# Patient Record
Sex: Male | Born: 1980
Health system: Southern US, Community
[De-identification: ages and names within clinical notes are randomized; demographics above are authoritative.]

## PROBLEM LIST (undated history)

## (undated) DIAGNOSIS — I10 Essential (primary) hypertension: Secondary | ICD-10-CM

## (undated) DIAGNOSIS — E669 Obesity, unspecified: Secondary | ICD-10-CM

## (undated) HISTORY — DX: Essential (primary) hypertension: I10

## (undated) HISTORY — PX: TYMPANOSTOMY TUBE PLACEMENT: SHX32

## (undated) HISTORY — DX: Obesity, unspecified: E66.9

## (undated) HISTORY — PX: KNEE SURGERY: SHX244

## (undated) HISTORY — PX: TUMOR REMOVAL: SHX12

---

## 2011-08-08 ENCOUNTER — Encounter: Payer: Self-pay | Admitting: Emergency Medicine

## 2011-08-08 ENCOUNTER — Inpatient Hospital Stay (INDEPENDENT_AMBULATORY_CARE_PROVIDER_SITE_OTHER)
Admission: RE | Admit: 2011-08-08 | Discharge: 2011-08-08 | Disposition: A | Discharge status: Home / Self Care | Payer: 59 | Source: Ambulatory Visit | Attending: Emergency Medicine | Admitting: Emergency Medicine

## 2011-08-08 DIAGNOSIS — I10 Essential (primary) hypertension: Secondary | ICD-10-CM | POA: Insufficient documentation

## 2011-08-08 DIAGNOSIS — L2089 Other atopic dermatitis: Secondary | ICD-10-CM

## 2011-11-05 NOTE — Progress Notes (Signed)
Summary: ITCHING   Vital Signs:  Patient Profile:   30 Years Old Male CC:      itching x 3days Weight:      343.25 pounds O2 Sat:      97 % O2 treatment:    Room Air Temp:     98.2 degrees F oral Pulse rate:   91 / minute Resp:     91 per minute BP sitting:   120 / 88  (left arm) Cuff size:   regular  Vitals Entered By: Clemens Catholic LPN (August 08, 2011 10:35 AM)                  Updated Prior Medication List: ALLEGRA ALLERGY 180 MG TABS (FEXOFENADINE HCL)  LISINOPRIL-HYDROCHLOROTHIAZIDE 20-25 MG TABS (LISINOPRIL-HYDROCHLOROTHIAZIDE)   Current Allergies: ! * DUST, MOLD, CATS, DOGSHistory of Present Illness History from: patient Chief Complaint: itching x 3days History of Present Illness: He has been itching for 3 days.   No new soaps, shampoos, detergents.  He did put on sunscreen a few days ago and spent some time outside.  Very itchy all over.  No rashes or lesions.  His wife doesn't have any symptoms.  No bites, etc.  Oatmeal bath helps.  Cortisone cream doesn't.  REVIEW OF SYSTEMS Constitutional Symptoms      Denies fever, chills, night sweats, weight loss, weight gain, and fatigue.  Eyes       Denies change in vision, eye pain, eye discharge, glasses, contact lenses, and eye surgery. Ear/Nose/Throat/Mouth       Denies hearing loss/aids, change in hearing, ear pain, ear discharge, dizziness, frequent runny nose, frequent nose bleeds, sinus problems, sore throat, hoarseness, and tooth pain or bleeding.  Respiratory       Denies dry cough, productive cough, wheezing, shortness of breath, asthma, bronchitis, and emphysema/COPD.  Cardiovascular       Denies murmurs, chest pain, and tires easily with exhertion.    Gastrointestinal       Denies stomach pain, nausea/vomiting, diarrhea, constipation, blood in bowel movements, and indigestion. Genitourniary       Denies painful urination, kidney stones, and loss of urinary control. Neurological       Denies  paralysis, seizures, and fainting/blackouts. Musculoskeletal       Denies muscle pain, joint pain, joint stiffness, decreased range of motion, redness, swelling, muscle weakness, and gout.  Skin       Denies bruising, unusual mles/lumps or sores, and hair/skin or nail changes.  Psych       Denies mood changes, temper/anger issues, anxiety/stress, speech problems, depression, and sleep problems. Other Comments: pt c/o itching all over x 3 days. started at the back of his knees and has continued to spread. he has taken benadryl and used oatmeal baths.   Past History:  Past Medical History: Hypertension  Past Surgical History: knee surgery  Family History: CA MS HTN  Social History: Never Smoked Alcohol use-no Drug use-no Smoking Status:  never Drug Use:  no Physical Exam General appearance: well developed, well nourished, no acute distress other than scratching Skin: no obvious rashes or lesions MSE: oriented to time, place, and person Assessment New Problems: DERMATITIS, ATOPIC (ICD-691.8) HYPERTENSION (ICD-401.9)   Plan New Medications/Changes: PREDNISONE (PAK) 10 MG TABS (PREDNISONE) 6 day pack, use as directed  #1 x 0, 08/08/2011, Hoyt Koch MD  New Orders: New Patient Level II [phone] Planning Comments:   Will put him on Prednisone.  Oral benedryl, oatmeal baths.  If  not improving, consider Elimite.  Follow-up with your primary care physician if not improving or if getting worse   The patient and/or caregiver has been counseled thoroughly with regard to medications prescribed including dosage, schedule, interactions, rationale for use, and possible side effects and they verbalize understanding.  Diagnoses and expected course of recovery discussed and will return if not improved as expected or if the condition worsens. Patient and/or caregiver verbalized understanding.  Prescriptions: PREDNISONE (PAK) 10 MG TABS (PREDNISONE) 6 day pack, use as directed  #1  x 0   Entered and Authorized by:   Hoyt Koch MD   Signed by:   Hoyt Koch MD on 08/08/2011   Method used:   Print then Give to Patient   RxID:   [id number]   Orders Added: 1)  New Patient Level II [30865]

## 2012-05-29 DIAGNOSIS — E669 Obesity, unspecified: Secondary | ICD-10-CM | POA: Insufficient documentation

## 2012-05-29 DIAGNOSIS — E668 Other obesity: Secondary | ICD-10-CM | POA: Insufficient documentation

## 2013-03-24 ENCOUNTER — Ambulatory Visit (INDEPENDENT_AMBULATORY_CARE_PROVIDER_SITE_OTHER): Payer: 59 | Admitting: Sports Medicine

## 2013-03-24 ENCOUNTER — Other Ambulatory Visit: Payer: Self-pay | Admitting: *Deleted

## 2013-03-24 ENCOUNTER — Encounter: Payer: Self-pay | Admitting: Sports Medicine

## 2013-03-24 VITALS — BP 166/95 | HR 91 | Wt 346.0 lb

## 2013-03-24 DIAGNOSIS — E669 Obesity, unspecified: Secondary | ICD-10-CM

## 2013-03-24 DIAGNOSIS — Z113 Encounter for screening for infections with a predominantly sexual mode of transmission: Secondary | ICD-10-CM | POA: Insufficient documentation

## 2013-03-24 DIAGNOSIS — Z0000 Encounter for general adult medical examination without abnormal findings: Secondary | ICD-10-CM | POA: Insufficient documentation

## 2013-03-24 DIAGNOSIS — I10 Essential (primary) hypertension: Secondary | ICD-10-CM | POA: Insufficient documentation

## 2013-03-24 DIAGNOSIS — Z299 Encounter for prophylactic measures, unspecified: Secondary | ICD-10-CM

## 2013-03-24 MED ORDER — LISINOPRIL 20 MG PO TABS: 20.0000 mg | ORAL_TABLET | Freq: Every day | ORAL | Status: DC

## 2013-03-24 MED ORDER — AMLODIPINE BESYLATE 10 MG PO TABS: 5.0000 mg | ORAL_TABLET | Freq: Every day | ORAL | Status: DC

## 2013-03-24 MED ORDER — PHENTERMINE HCL 37.5 MG PO CAPS: 37.5000 mg | ORAL_CAPSULE | ORAL | Status: DC

## 2013-03-24 MED ORDER — CHLORTHALIDONE 50 MG PO TABS: 50.0000 mg | ORAL_TABLET | Freq: Every day | ORAL | Status: DC

## 2013-03-24 NOTE — Assessment & Plan Note (Signed)
Nutrition, exercise prescription, phentermine.

## 2013-03-24 NOTE — Progress Notes (Signed)
  Subjective:    CC: Establish care.   HPI: This is a very pleasant 32 year old male who comes in to establish care. He is switching from his prior provider as his access was difficult.  Hypertension: Has never really been well controlled, and has previously been on lisinopril/hydrochlorothiazide. He does work as a Pharmacologist and is wondering if we can switch to chlorthalidone and lisinopril. Denies any chest pain, headaches, visual changes.  Obesity: Tried the [employer] and lost a lot of weight but unfortunately gained it back immediately. Is interested in a multimodal approach to treating his obesity.  STD screening colon would like to be checked for all STDs.  Past medical history, Surgical history, Family history not pertinant except as noted below, Social history, Allergies, and medications have been entered into the medical record, reviewed, and no changes needed.   Review of Systems: No headache, visual changes, nausea, vomiting, diarrhea, constipation, dizziness, abdominal pain, skin rash, fevers, chills, night sweats, swollen lymph nodes, weight loss, chest pain, body aches, joint swelling, muscle aches, shortness of breath, mood changes, visual or auditory hallucinations.  Objective:    General: Well Developed, well nourished, and in no acute distress.  Neuro: Alert and oriented x3, extra-ocular muscles intact, sensation grossly intact.  HEENT: Normocephalic, atraumatic, pupils equal round reactive to light, neck supple, no masses, no lymphadenopathy, thyroid nonpalpable.  Skin: Warm and dry, no rashes noted.  Cardiac: Regular rate and rhythm, no murmurs rubs or gallops.  Respiratory: Clear to auscultation bilaterally. Not using accessory muscles, speaking in full sentences.  Abdominal: Soft, nontender, nondistended, positive bowel sounds, no masses, no organomegaly.  Musculoskeletal: Shoulder, elbow, wrist, hip, knee, ankle stable, and with full range of  motion Impression and Recommendations:    The patient was counselled, risk factors were discussed, anticipatory guidance given.

## 2013-03-24 NOTE — Assessment & Plan Note (Signed)
Checking blood work and urine.

## 2013-03-24 NOTE — Patient Instructions (Addendum)
Exercise prescription:  You should adjust the intensity of your exercise based on your heart rate. The [employer] sports medicine recommends keeping your heart rate between 70-80% of its maximum for 30 minutes, 3-5 times per week. Maximum heart rate = (220 - age). Multiply this number by 0.75 to get your goal heart rate. If lower, then increase the intensity of your exercise. If the number is higher, you may decrease the intensity of your exercise. 

## 2013-03-24 NOTE — Assessment & Plan Note (Signed)
Switching to lisinopril and chlorthalidone.

## 2013-04-21 ENCOUNTER — Ambulatory Visit (INDEPENDENT_AMBULATORY_CARE_PROVIDER_SITE_OTHER): Payer: 59 | Admitting: Sports Medicine

## 2013-04-21 VITALS — BP 149/85 | HR 101 | Wt 333.0 lb

## 2013-04-21 DIAGNOSIS — E669 Obesity, unspecified: Secondary | ICD-10-CM

## 2013-04-21 DIAGNOSIS — I10 Essential (primary) hypertension: Secondary | ICD-10-CM

## 2013-04-21 MED ORDER — PHENTERMINE HCL 37.5 MG PO CAPS: 37.5000 mg | ORAL_CAPSULE | ORAL | Status: DC

## 2013-04-21 NOTE — Progress Notes (Signed)
  Subjective:    CC: Followup  HPI: Hypertension: Greatly improved on 5 mg of amlodipine, 20 mg lisinopril, 50 mg chlorthalidone.  Obesity: 13 pounds down.  Skin tags: Does not want them removed.  Past medical history, Surgical history, Family history not pertinant except as noted below, Social history, Allergies, and medications have been entered into the medical record, reviewed, and no changes needed.   Review of Systems: No fevers, chills, night sweats, weight loss, chest pain, or shortness of breath.   Objective:    General: Well Developed, well nourished, and in no acute distress.  Neuro: Alert and oriented x3, extra-ocular muscles intact, sensation grossly intact.  HEENT: Normocephalic, atraumatic, pupils equal round reactive to light, neck supple, no masses, no lymphadenopathy, thyroid nonpalpable.  Skin: Warm and dry, no rashes. Skin tags present. Cardiac: Regular rate and rhythm, no murmurs rubs or gallops, no lower extremity edema.  Respiratory: Clear to auscultation bilaterally. Not using accessory muscles, speaking in full sentences. Impression and Recommendations:

## 2013-04-21 NOTE — Assessment & Plan Note (Signed)
13 pounds weight loss so far. Need to keep an eye on tachycardia at the next visit. Does have nutritionist visit coming up. Refilling phentermine.

## 2013-04-21 NOTE — Assessment & Plan Note (Signed)
Pressures greatly improved, increasing to 10 mg of amlodipine.

## 2013-04-30 ENCOUNTER — Encounter: Payer: Self-pay | Admitting: *Deleted

## 2013-04-30 ENCOUNTER — Encounter: Payer: 59 | Attending: Sports Medicine | Admitting: *Deleted

## 2013-04-30 VITALS — Ht 70.0 in | Wt 331.9 lb

## 2013-04-30 DIAGNOSIS — E663 Overweight: Secondary | ICD-10-CM | POA: Insufficient documentation

## 2013-04-30 DIAGNOSIS — E669 Obesity, unspecified: Secondary | ICD-10-CM

## 2013-04-30 DIAGNOSIS — Z713 Dietary counseling and surveillance: Secondary | ICD-10-CM | POA: Insufficient documentation

## 2013-04-30 DIAGNOSIS — I10 Essential (primary) hypertension: Secondary | ICD-10-CM

## 2013-04-30 NOTE — Progress Notes (Signed)
Medical Nutrition Therapy:  Appt start time: 0900 end time:  1000.  Assessment:  Primary concerns today: Alexander Russo is here for weight loss education.  He has attempted to lose weight on his own via [employer].  He has been on and off diets about 3 times and is usually successful, but then plateaus and gets frustrated then gains the weight back.  He hasn't been as active before and is trying to be more active.  He is now doing physician's assisted weight loss with phentermine    He had knee surgery at 16.  He was heavy as a child, but very active.  After the surgery, he had to stop contact sports.  He ate the same way as before, but couldn't be active.  His heaviest weight is about 354 lb.  He isn't sure about his lowest adult weight, but maybe around 230 after her lost about 90 pounds with Alexander Russo.  He would love to weigh about 200 lb.  He has been making changes for the past month or so.  MEDICATIONS: see list   DIETARY INTAKE:  Usual eating pattern includes 3 meals and 1-2 snacks per day.  Everyday foods include protein, starches.  Avoided foods include doesn't eat that much sweets, cakes, etc.  Limits fried food.    24-hr recall:  B ( AM): frozen hashbrown pattie.  Whole wheat mini bagel with banana ketchup and cream cheese.    Snk ( AM): beef jerky (low-sodium)  L ( PM): cookout chicken quesadilla or grilled chicken sandwich or fried chicken wrap.  Sometimes doesn't eat the bread Snk ( PM): used to have greek yogurt, but not much any more.  Doesn't snack much D ( PM): once a week has vegetarian night.  usually eats home-cooked meal.  Meat, starch. typically has a vegetable, but not so much lately.  Has been going out a little more often Snk ( PM): 2 servings of sour patch kids or 2 frozen popsicle Beverages: water, maybe diluted tea sometimes  Usual physical activity: PE class twice a week.  On his feet at work.  Has an exercise prescription, but hasn't gotten into it yet  Estimated energy  needs: 2600 calories 292 g carbohydrates 195 g protein 72 g fat  Progress Towards Goal(s):  In progress.   Nutritional Diagnosis:  [place]-3.3 Overweight/obesity As related to genetic predisposition towards heavier size combined with limtied adherance to interal hunger and fullness cues.  As evidenced by BMI>45    Intervention:  Nutrition counseling provided.  Alexander Russo's diet is low in whole grains, dairy, fruits, and vegetables.  Recommended adding yogurt and fruits back in as snacks.  Recommended bringing lunch from home in order to use whole grain breads, fruits, leaner proteins, maybe vegetables, etc.  Right now his lunch time is only 30 minutes so he's driving to fast food and scarfing down his lunch in his car quickly  Also recommended cooking more at home in order to have whole grains like brown rice, quinoa, whole wheat spaghetti, etc.  Eating more at home will reduce his sodium consumption.  This will also allow more vegetables.  Recommended the farmers' market so that his kids can be exposed to new vegetables while they're young. Educated Alexander Russo to eat more slowly. Sit at a table, not in the living room or the car, etc.  Aim to make meals last at least 20 minutes in order to have time to register satiety. Take smaller bites, chew food thoroughly, etc.  Gauge  fullness throughout the meal.  Stop eating when comfortably full, and not stuffed.  During that day, take time to gauge hunger.  Eat when physically hungry, not when bored.  Do not skip meals to snacks; eat when hungry.  Honor internal hunger/fullness cues, not a prescribed calorie limit.  Encouraged patient to reject traditional diet mentality of "good" vs "bad" foods.  There are no good and bad foods, but rather food is fuel that we needs for our bodies.  When we don't get enough fuel, our bodies suffer the metabolic consequences.    I also suggested Alexander Mellendick, MS, RD, LDN, CSCS for exercise guidance   Monitoring/Evaluation:  Dietary  intake, exercise, and body weight in 2 month(s).  Patient will call to set up follow up visit

## 2013-05-26 ENCOUNTER — Ambulatory Visit (INDEPENDENT_AMBULATORY_CARE_PROVIDER_SITE_OTHER): Payer: 59 | Admitting: Sports Medicine

## 2013-05-26 VITALS — BP 135/82 | HR 103 | Wt 330.0 lb

## 2013-05-26 DIAGNOSIS — E669 Obesity, unspecified: Secondary | ICD-10-CM

## 2013-05-26 DIAGNOSIS — I10 Essential (primary) hypertension: Secondary | ICD-10-CM

## 2013-05-26 MED ORDER — LISINOPRIL 20 MG PO TABS: 20.0000 mg | ORAL_TABLET | Freq: Every day | ORAL | Status: DC

## 2013-05-26 MED ORDER — CHLORTHALIDONE 50 MG PO TABS: 50.0000 mg | ORAL_TABLET | Freq: Every day | ORAL | Status: DC

## 2013-05-26 MED ORDER — AMLODIPINE BESYLATE 10 MG PO TABS: 10.0000 mg | ORAL_TABLET | Freq: Every day | ORAL | Status: DC

## 2013-05-26 MED ORDER — PHENTERMINE HCL 37.5 MG PO TABS: 37.5000 mg | ORAL_TABLET | Freq: Every day | ORAL | Status: DC

## 2013-05-26 NOTE — Progress Notes (Signed)
  Subjective:    CC: Followup.  HPI: Hypertension:  Overall better controlled, needs refills.  Obesity: Has seen the nutritionist, overall 3 pounds weight loss, due for a refill, no adverse effects.  Past medical history, Surgical history, Family history not pertinant except as noted below, Social history, Allergies, and medications have been entered into the medical record, reviewed, and no changes needed.   Review of Systems: No fevers, chills, night sweats, weight loss, chest pain, or shortness of breath.   Objective:    General: Well Developed, well nourished, and in no acute distress.  Neuro: Alert and oriented x3, extra-ocular muscles intact, sensation grossly intact.  HEENT: Normocephalic, atraumatic, pupils equal round reactive to light, neck supple, no masses, no lymphadenopathy, thyroid nonpalpable.  Skin: Warm and dry, no rashes. Cardiac: Regular rate and rhythm, no murmurs rubs or gallops, no lower extremity edema.  Respiratory: Clear to auscultation bilaterally. Not using accessory muscles, speaking in full sentences.  Impression and Recommendations:

## 2013-05-26 NOTE — Assessment & Plan Note (Signed)
Refilling phentermine. Return in one month for weight check and refills.

## 2013-05-26 NOTE — Assessment & Plan Note (Signed)
Controlled, no changes. 

## 2013-06-30 ENCOUNTER — Encounter: Payer: Self-pay | Admitting: Sports Medicine

## 2013-06-30 ENCOUNTER — Ambulatory Visit (INDEPENDENT_AMBULATORY_CARE_PROVIDER_SITE_OTHER): Payer: 59 | Admitting: Sports Medicine

## 2013-06-30 VITALS — BP 132/78 | HR 96 | Wt 337.0 lb

## 2013-06-30 DIAGNOSIS — I10 Essential (primary) hypertension: Secondary | ICD-10-CM

## 2013-06-30 DIAGNOSIS — E669 Obesity, unspecified: Secondary | ICD-10-CM

## 2013-06-30 MED ORDER — PHENTERMINE HCL 37.5 MG PO TABS: 37.5000 mg | ORAL_TABLET | Freq: Every day | ORAL | Status: DC

## 2013-06-30 MED ORDER — TOPIRAMATE 50 MG PO TABS: ORAL_TABLET | ORAL | Status: DC

## 2013-06-30 MED ORDER — CHLORTHALIDONE 100 MG PO TABS: 100.0000 mg | ORAL_TABLET | Freq: Every day | ORAL | Status: DC

## 2013-06-30 NOTE — Progress Notes (Signed)
  Subjective:    CC: Followup  HPI: Obesity: Unfortunately [place] has gained some weight, he go on a road trip and he ate poorly.  Hypertension: Overall unchanged from the prior visit. No chest pain, visual changes, headaches.  Past medical history, Surgical history, Family history not pertinant except as noted below, Social history, Allergies, and medications have been entered into the medical record, reviewed, and no changes needed.   Review of Systems: No fevers, chills, night sweats, weight loss, chest pain, or shortness of breath.   Objective:    General: Well Developed, well nourished, and in no acute distress.  Neuro: Alert and oriented x3, extra-ocular muscles intact, sensation grossly intact.  HEENT: Normocephalic, atraumatic, pupils equal round reactive to light, neck supple, no masses, no lymphadenopathy, thyroid nonpalpable.  Skin: Warm and dry, no rashes. Cardiac: Regular rate and rhythm, no murmurs rubs or gallops, 1+ lower extremity edema.  Respiratory: Clear to auscultation bilaterally. Not using accessory muscles, speaking in full sentences.  Impression and Recommendations:

## 2013-06-30 NOTE — Assessment & Plan Note (Signed)
Increasing chlorthalidone.

## 2013-06-30 NOTE — Assessment & Plan Note (Addendum)
Refilling phentermine. I am going to increase his chlorthalidone in the hopes that some of his weight is from water overload. If he continues to gain weight at the next visit we will discontinue phentermine. I am going to add a Topamax.

## 2013-07-28 ENCOUNTER — Ambulatory Visit (INDEPENDENT_AMBULATORY_CARE_PROVIDER_SITE_OTHER): Payer: 59 | Admitting: Sports Medicine

## 2013-07-28 ENCOUNTER — Encounter: Payer: Self-pay | Admitting: Sports Medicine

## 2013-07-28 VITALS — BP 120/70 | HR 95 | Wt 325.0 lb

## 2013-07-28 DIAGNOSIS — I10 Essential (primary) hypertension: Secondary | ICD-10-CM

## 2013-07-28 DIAGNOSIS — E669 Obesity, unspecified: Secondary | ICD-10-CM

## 2013-07-28 MED ORDER — PHENTERMINE HCL 37.5 MG PO TABS: 37.5000 mg | ORAL_TABLET | Freq: Every day | ORAL | Status: DC

## 2013-07-28 NOTE — Assessment & Plan Note (Signed)
Well controlled, no changes 

## 2013-07-28 NOTE — Progress Notes (Signed)
  Subjective:    CC: Followup  HPI: Obesity: 12 pound weight loss since the last visit.  Hypertension: Well controlled with current medications.  Past medical history, Surgical history, Family history not pertinant except as noted below, Social history, Allergies, and medications have been entered into the medical record, reviewed, and no changes needed.   Review of Systems: No fevers, chills, night sweats, weight loss, chest pain, or shortness of breath.   Objective:    General: Well Developed, well nourished, and in no acute distress.  Neuro: Alert and oriented x3, extra-ocular muscles intact, sensation grossly intact.  HEENT: Normocephalic, atraumatic, pupils equal round reactive to light, neck supple, no masses, no lymphadenopathy, thyroid nonpalpable.  Skin: Warm and dry, no rashes. Cardiac: Regular rate and rhythm, no murmurs rubs or gallops, no lower extremity edema.  Respiratory: Clear to auscultation bilaterally. Not using accessory muscles, speaking in full sentences. Impression and Recommendations:

## 2013-07-28 NOTE — Assessment & Plan Note (Signed)
12 pound weight loss, refilling phentermine, needs 37 pills for this refill.

## 2013-07-31 ENCOUNTER — Other Ambulatory Visit: Payer: Self-pay | Admitting: Sports Medicine

## 2013-08-31 ENCOUNTER — Emergency Department (INDEPENDENT_AMBULATORY_CARE_PROVIDER_SITE_OTHER)
Admission: EM | Admit: 2013-08-31 | Discharge: 2013-08-31 | Disposition: A | Discharge status: Home / Self Care | Payer: 59 | Source: Home / Self Care

## 2013-08-31 ENCOUNTER — Encounter: Payer: Self-pay | Admitting: *Deleted

## 2013-08-31 DIAGNOSIS — J209 Acute bronchitis, unspecified: Secondary | ICD-10-CM

## 2013-08-31 MED ORDER — AZITHROMYCIN 250 MG PO TABS: ORAL_TABLET | ORAL | Status: DC

## 2013-08-31 MED ORDER — PREDNISONE (PAK) 10 MG PO TABS: 10.0000 mg | ORAL_TABLET | Freq: Every day | ORAL | Status: DC

## 2013-08-31 MED ORDER — ALBUTEROL SULFATE HFA 108 (90 BASE) MCG/ACT IN AERS: 1.0000 | INHALATION_SPRAY | Freq: Four times a day (QID) | RESPIRATORY_TRACT | Status: DC | PRN

## 2013-08-31 NOTE — ED Notes (Signed)
Alexander Russo c/o congestion, productive cough (green turned clear), fatigue and SOB with exertion. Denies fever. Symptoms started and progressed 4 days ago.

## 2013-08-31 NOTE — ED Provider Notes (Signed)
CSN: [id number]     Arrival date & time 08/31/13  1246 History   None    Chief Complaint  Patient presents with  . URI   (Consider location/radiation/quality/duration/timing/severity/associated sxs/prior Treatment) HPI Arion is a 32 y.o. male who complains of onset of cold symptoms for 7 days.  The symptoms are constant and mild-moderate in severity and improving over the last few days.  However, still with cough and chest congestion.  Everybody in his house is sick.  Lots of stress recently with illness, school, and job. No sore throat + cough (productive green but turned clear) No pleuritic pain +/- wheezing + nasal congestion + post-nasal drainage No sinus pain/pressure + chest congestion (improving) No itchy/red eyes No earache No hemoptysis +/- SOB No chills/sweats No fever No nausea No vomiting No abdominal pain No diarrhea No skin rashes + fatigue No myalgias + headache    Past Medical History  Diagnosis Date  . Hypertension   . Obesity    Past Surgical History  Procedure Laterality Date  . Tumor removal    . Tympanostomy tube placement    . Knee surgery     Family History  Problem Relation Age of Onset  . Cancer Mother   . Depression Mother   . Hypertension Mother   . Multiple sclerosis Mother   . Hypertension Father   . Depression Sister   . Hypertension Sister    History  Substance Use Topics  . Smoking status: Never Smoker   . Smokeless tobacco: Never Used  . Alcohol Use: Yes    Review of Systems  All other systems reviewed and are negative.    Allergies  Review of patient's allergies indicates no known allergies.  Home Medications   Current Outpatient Rx  Name  Route  Sig  Dispense  Refill  . albuterol (PROVENTIL HFA;VENTOLIN HFA) 108 (90 BASE) MCG/ACT inhaler   Inhalation   Inhale 1-2 puffs into the lungs every 6 (six) hours as needed for wheezing.   1 Inhaler   5   . amLODipine (NORVASC) 10 MG tablet   Oral   Take 1  tablet (10 mg total) by mouth daily.   90 tablet   3   . azithromycin (ZITHROMAX Z-PAK) 250 MG tablet      Use as directed   1 each   0   . chlorthalidone (HYGROTEN) 100 MG tablet   Oral   Take 50 mg by mouth daily. Take 1 tablet 2 times a day         . lisinopril (PRINIVIL,ZESTRIL) 20 MG tablet   Oral   Take 1 tablet (20 mg total) by mouth daily.   90 tablet   3   . phentermine (ADIPEX-P) 37.5 MG tablet   Oral   Take 1 tablet (37.5 mg total) by mouth daily before breakfast.   37 tablet   0   . predniSONE (STERAPRED UNI-PAK) 10 MG tablet   Oral   Take 1 tablet (10 mg total) by mouth daily. 6 day pack, use as directed   21 tablet   0   . topiramate (TOPAMAX) 50 MG tablet      Take one half tablet by mouth once daily for a week, then one tablet by mouth daily thereafter.   30 tablet   1    BP 125/82  Pulse 103  Temp(Src) 98.1 F (36.7 C) (Oral)  Resp 18  Ht 5\' 10"  (1.778 m)  Wt 323 lb (146.512 kg)  BMI 46.35 kg/m2  SpO2 100% Physical Exam  Nursing note and vitals reviewed. Constitutional: He is oriented to person, place, and time. He appears well-developed and well-nourished. He does not appear ill.  HENT:  Head: Normocephalic and atraumatic.  Right Ear: Tympanic membrane, external ear and ear canal normal.  Left Ear: Tympanic membrane, external ear and ear canal normal.  Nose: Mucosal edema present.  Mouth/Throat: No oropharyngeal exudate, posterior oropharyngeal edema or posterior oropharyngeal erythema.  Eyes: No scleral icterus.  Neck: Neck supple.  Cardiovascular: Regular rhythm and normal heart sounds.   Pulmonary/Chest: Effort normal and breath sounds normal. No respiratory distress. He has no decreased breath sounds. He has no wheezes. He has no rhonchi.  Neurological: He is alert and oriented to person, place, and time.  Skin: Skin is warm and dry.  Psychiatric: He has a normal mood and affect. His speech is normal.    ED Course  Procedures  (including critical care time) Labs Review Labs Reviewed - No data to display Imaging Review No results found.  MDM   1. Acute bronchitis    1)  Hold ABX unless getting worse.  Rx for pred pack and albuterol given.  Likely viral / post viral mild bronchospasm / bronchitis.  No CXR warranted today. 2)  Use nasal saline solution (over the counter) at least 3 times a day. 3)  Use over the counter decongestants like Zyrtec-D every 12 hours as needed to help with congestion.  If you have hypertension, do not take medicines with sudafed.  4)  Can take tylenol for pain or fever. 5)  Follow up with your primary doctor if no improvement in 5-7 days, sooner if increasing pain, fever, or new symptoms.     Marlaine Hind, MD 08/31/13 1329

## 2013-09-03 ENCOUNTER — Ambulatory Visit (INDEPENDENT_AMBULATORY_CARE_PROVIDER_SITE_OTHER): Payer: 59 | Admitting: Sports Medicine

## 2013-09-03 ENCOUNTER — Encounter: Payer: Self-pay | Admitting: Sports Medicine

## 2013-09-03 VITALS — BP 147/90 | HR 97 | Wt 327.0 lb

## 2013-09-03 DIAGNOSIS — I10 Essential (primary) hypertension: Secondary | ICD-10-CM

## 2013-09-03 DIAGNOSIS — E669 Obesity, unspecified: Secondary | ICD-10-CM

## 2013-09-03 MED ORDER — TOPIRAMATE 50 MG PO TABS: ORAL_TABLET | ORAL | Status: DC

## 2013-09-03 MED ORDER — PHENTERMINE HCL 37.5 MG PO TABS: 37.5000 mg | ORAL_TABLET | Freq: Every day | ORAL | Status: DC

## 2013-09-03 NOTE — Assessment & Plan Note (Signed)
Refilling for 3 months. Return in 3 months.

## 2013-09-03 NOTE — Assessment & Plan Note (Signed)
Will recheck in 3 months, forgot a couple BP medications.

## 2013-09-03 NOTE — Progress Notes (Signed)
  Subjective:    CC: Weight check  HPI: Obesity: Gained 2 pounds since last visit, continues on phentermine and Topamax. No side effects.  Hypertension: Slightly elevated since last visit, was perfectly controlled before. Currently on amlodipine, chlorthalidone, lisinopril. He does tell me he forgot his amlodipine.  Past medical history, Surgical history, Family history not pertinant except as noted below, Social history, Allergies, and medications have been entered into the medical record, reviewed, and no changes needed.   Review of Systems: No fevers, chills, night sweats, weight loss, chest pain, or shortness of breath.   Objective:    General: Well Developed, well nourished, and in no acute distress.  Neuro: Alert and oriented x3, extra-ocular muscles intact, sensation grossly intact.  HEENT: Normocephalic, atraumatic, pupils equal round reactive to light, neck supple, no masses, no lymphadenopathy, thyroid nonpalpable.  Skin: Warm and dry, no rashes. Cardiac: Regular rate and rhythm, no murmurs rubs or gallops, no lower extremity edema.  Respiratory: Clear to auscultation bilaterally. Not using accessory muscles, speaking in full sentences. Impression and Recommendations:

## 2013-12-10 ENCOUNTER — Encounter: Payer: 59 | Admitting: Sports Medicine

## 2013-12-23 ENCOUNTER — Encounter: Payer: Self-pay | Admitting: Sports Medicine

## 2013-12-23 ENCOUNTER — Ambulatory Visit (INDEPENDENT_AMBULATORY_CARE_PROVIDER_SITE_OTHER): Payer: 59 | Admitting: Sports Medicine

## 2013-12-23 VITALS — BP 125/68 | HR 92 | Ht 70.0 in | Wt 338.0 lb

## 2013-12-23 DIAGNOSIS — Z0000 Encounter for general adult medical examination without abnormal findings: Secondary | ICD-10-CM

## 2013-12-23 DIAGNOSIS — I10 Essential (primary) hypertension: Secondary | ICD-10-CM

## 2013-12-23 DIAGNOSIS — Z299 Encounter for prophylactic measures, unspecified: Secondary | ICD-10-CM

## 2013-12-23 DIAGNOSIS — E669 Obesity, unspecified: Secondary | ICD-10-CM

## 2013-12-23 LAB — LIPID PANEL
Cholesterol: 131 mg/dL (ref 0–200)
HDL: 41 mg/dL (ref >39)
LDL Cholesterol: 79 mg/dL (ref 0–99)
Total CHOL/HDL Ratio: 3.2 ratio
Triglycerides: 57 mg/dL (ref <150)
VLDL: 11 mg/dL (ref 0–40)

## 2013-12-23 LAB — COMPREHENSIVE METABOLIC PANEL WITH GFR
ALT: 27 U/L (ref 0–53)
AST: 20 U/L (ref 0–37)
Chloride: 93 meq/L — ABNORMAL LOW (ref 96–112)
Creat: 0.72 mg/dL (ref 0.50–1.35)
Sodium: 134 meq/L — ABNORMAL LOW (ref 135–145)
Total Bilirubin: 0.5 mg/dL (ref 0.3–1.2)
Total Protein: 7.1 g/dL (ref 6.0–8.3)

## 2013-12-23 LAB — CBC
HCT: 43.8 % (ref 39.0–52.0)
Hemoglobin: 15.6 g/dL (ref 13.0–17.0)
MCH: 31 pg (ref 26.0–34.0)
MCHC: 35.6 g/dL (ref 30.0–36.0)
MCV: 86.9 fL (ref 78.0–100.0)
Platelets: 324 K/uL (ref 150–400)
RBC: 5.04 MIL/uL (ref 4.22–5.81)
RDW: 13.8 % (ref 11.5–15.5)
WBC: 7.7 K/uL (ref 4.0–10.5)

## 2013-12-23 LAB — COMPREHENSIVE METABOLIC PANEL
Albumin: 4.2 g/dL (ref 3.5–5.2)
Alkaline Phosphatase: 66 U/L (ref 39–117)
BUN: 19 mg/dL (ref 6–23)
CO2: 30 mEq/L (ref 19–32)
Calcium: 9 mg/dL (ref 8.4–10.5)
Glucose, Bld: 91 mg/dL (ref 70–99)
Potassium: 3.2 mEq/L — ABNORMAL LOW (ref 3.5–5.3)

## 2013-12-23 LAB — HEMOGLOBIN A1C
Hgb A1c MFr Bld: 5.4 % (ref <5.7)
Mean Plasma Glucose: 108 mg/dL (ref <117)

## 2013-12-23 LAB — TSH: TSH: 2.589 u[IU]/mL (ref 0.350–4.500)

## 2013-12-23 NOTE — Progress Notes (Signed)
  Subjective:    CC: CPE   HPI:  Hypertension, well controlled on recheck.  Obesity: Did not lose weight with phentermine and Topamax, we have discontinued this. He does desire to continue home dietary changes and exercise.  Preventive measures will complete physical will be performed today, he is due for some blood work, he is changing jobs to work for a [employer].  Past medical history, Surgical history, Family history not pertinant except as noted below, Social history, Allergies, and medications have been entered into the medical record, reviewed, and no changes needed.   Review of Systems: No headache, visual changes, nausea, vomiting, diarrhea, constipation, dizziness, abdominal pain, skin rash, fevers, chills, night sweats, swollen lymph nodes, weight loss, chest pain, body aches, joint swelling, muscle aches, shortness of breath, mood changes, visual or auditory hallucinations.  Objective:    General: Well Developed, well nourished, and in no acute distress.  Neuro: Alert and oriented x3, extra-ocular muscles intact, sensation grossly intact.  HEENT: Normocephalic, atraumatic, pupils equal round reactive to light, neck supple, no masses, no lymphadenopathy, thyroid nonpalpable.  Skin: Warm and dry, no rashes noted.  Early venous stasis hyperpigmentation noted on both legs. Cardiac: Regular rate and rhythm, no murmurs rubs or gallops.  Respiratory: Clear to auscultation bilaterally. Not using accessory muscles, speaking in full sentences.  Abdominal: Soft, nontender, nondistended, positive bowel sounds, no masses, no organomegaly.  Musculoskeletal: Shoulder, elbow, wrist, hip, knee, ankle stable, and with full range of motion. Genital: Normal, no masses, no hernias.   Impression and Recommendations:    The patient was counselled, risk factors were discussed, anticipatory guidance given.

## 2013-12-23 NOTE — Assessment & Plan Note (Signed)
No improvement with Topamax for phentermine, discontinuing these.

## 2013-12-23 NOTE — Assessment & Plan Note (Addendum)
Well-controlled, no changes.  Slightly hypokalemic, hyponatremic, and hypochloremic. He is going to liberalize salt in his diet, I am going to add 40 mEq of potassium daily. Recheck metabolic panel in one week. If ineffective we will need to add a potassium sparing diuretic.

## 2013-12-23 NOTE — Assessment & Plan Note (Addendum)
Complete physical today. Return in one year. Checking routine blood work.

## 2013-12-24 ENCOUNTER — Other Ambulatory Visit: Payer: Self-pay

## 2013-12-24 DIAGNOSIS — I10 Essential (primary) hypertension: Secondary | ICD-10-CM

## 2013-12-24 MED ORDER — POTASSIUM CHLORIDE CRYS ER 20 MEQ PO TBCR: 40.0000 meq | EXTENDED_RELEASE_TABLET | Freq: Every day | ORAL | Status: DC

## 2013-12-24 NOTE — Addendum Note (Signed)
Addended by: Monica BectonHEKKEKANDAM, THOMAS J on: 12/24/2013 01:05 PM   Modules accepted: Orders

## 2014-01-13 ENCOUNTER — Encounter: Payer: Self-pay | Admitting: Sports Medicine

## 2014-01-14 ENCOUNTER — Other Ambulatory Visit: Payer: Self-pay

## 2014-01-14 MED ORDER — AMBULATORY NON FORMULARY MEDICATION: Status: DC

## 2014-06-01 ENCOUNTER — Other Ambulatory Visit: Payer: Self-pay | Admitting: Sports Medicine

## 2014-06-09 ENCOUNTER — Ambulatory Visit: Payer: Self-pay | Admitting: Sports Medicine

## 2014-06-16 ENCOUNTER — Encounter: Payer: Self-pay | Admitting: Sports Medicine

## 2014-06-16 ENCOUNTER — Ambulatory Visit (INDEPENDENT_AMBULATORY_CARE_PROVIDER_SITE_OTHER): Payer: BC Managed Care – PPO | Admitting: Sports Medicine

## 2014-06-16 VITALS — BP 129/78 | HR 90 | Ht 70.0 in | Wt 376.0 lb

## 2014-06-16 DIAGNOSIS — E669 Obesity, unspecified: Secondary | ICD-10-CM | POA: Diagnosis not present

## 2014-06-16 DIAGNOSIS — I10 Essential (primary) hypertension: Secondary | ICD-10-CM

## 2014-06-16 NOTE — Progress Notes (Signed)
  Subjective:    CC: Followup  HPI: Hypertension: Well controlled.   Obesity: Unfortunately has gained 40 pounds in 6 months. This brings his body mass index over 50. He is having foot pain suggestive of early osteoarthritis, and does have hypertension. He is amenable to discuss surgical weight loss.  Past medical history, Surgical history, Family history not pertinant except as noted below, Social history, Allergies, and medications have been entered into the medical record, reviewed, and no changes needed.   Review of Systems: No fevers, chills, night sweats, weight loss, chest pain, or shortness of breath.   Objective:    General: Well Developed, well nourished, and in no acute distress.  Neuro: Alert and oriented x3, extra-ocular muscles intact, sensation grossly intact.  HEENT: Normocephalic, atraumatic, pupils equal round reactive to light, neck supple, no masses, no lymphadenopathy, thyroid nonpalpable.  Skin: Warm and dry, no rashes. Cardiac: Regular rate and rhythm, no murmurs rubs or gallops, no lower extremity edema.  Respiratory: Clear to auscultation bilaterally. Not using accessory muscles, speaking in full sentences.  Impression and Recommendations:

## 2014-06-16 NOTE — Assessment & Plan Note (Signed)
Doing very well. He was a little bit hypokalemic on the last check, has been moderately compliant with potassium supplementation, if still hypokalemic we could certainly add Spiriva lactone.

## 2014-06-16 NOTE — Assessment & Plan Note (Signed)
Lost some weight on phentermine, and he gained 40 pounds back in the last 6 months. At this point we are going to refer for consideration of bariatric surgery.

## 2014-06-17 LAB — CBC
HCT: 44.2 % (ref 39.0–52.0)
Hemoglobin: 15.6 g/dL (ref 13.0–17.0)
MCH: 30.6 pg (ref 26.0–34.0)
MCHC: 35.3 g/dL (ref 30.0–36.0)
MCV: 86.7 fL (ref 78.0–100.0)
Platelets: 302 10*3/uL (ref 150–400)
RBC: 5.1 MIL/uL (ref 4.22–5.81)
RDW: 14.3 % (ref 11.5–15.5)
WBC: 8.8 10*3/uL (ref 4.0–10.5)

## 2014-06-17 LAB — COMPREHENSIVE METABOLIC PANEL
ALT: 38 U/L (ref 0–53)
BUN: 19 mg/dL (ref 6–23)
CO2: 29 mEq/L (ref 19–32)
Calcium: 9 mg/dL (ref 8.4–10.5)
Chloride: 99 mEq/L (ref 96–112)
Creat: 0.65 mg/dL (ref 0.50–1.35)
Glucose, Bld: 98 mg/dL (ref 70–99)
Total Bilirubin: 0.6 mg/dL (ref 0.2–1.2)

## 2014-06-17 LAB — LIPID PANEL
Cholesterol: 139 mg/dL (ref 0–200)
HDL: 37 mg/dL — ABNORMAL LOW (ref >39)
LDL Cholesterol: 87 mg/dL (ref 0–99)
Total CHOL/HDL Ratio: 3.8 ratio
Triglycerides: 76 mg/dL (ref <150)
VLDL: 15 mg/dL (ref 0–40)

## 2014-06-17 LAB — COMPREHENSIVE METABOLIC PANEL WITH GFR
AST: 24 U/L (ref 0–37)
Albumin: 4 g/dL (ref 3.5–5.2)
Alkaline Phosphatase: 60 U/L (ref 39–117)
Potassium: 3.3 meq/L — ABNORMAL LOW (ref 3.5–5.3)
Sodium: 137 meq/L (ref 135–145)
Total Protein: 7 g/dL (ref 6.0–8.3)

## 2014-06-17 LAB — TSH: TSH: 2.459 u[IU]/mL (ref 0.350–4.500)

## 2014-06-17 LAB — HEMOGLOBIN A1C
Hgb A1c MFr Bld: 5.5 % (ref <5.7)
Mean Plasma Glucose: 111 mg/dL (ref <117)

## 2014-07-28 ENCOUNTER — Other Ambulatory Visit: Payer: Self-pay | Admitting: Sports Medicine

## 2014-09-01 ENCOUNTER — Emergency Department (INDEPENDENT_AMBULATORY_CARE_PROVIDER_SITE_OTHER): Payer: BC Managed Care – PPO

## 2014-09-01 ENCOUNTER — Emergency Department (INDEPENDENT_AMBULATORY_CARE_PROVIDER_SITE_OTHER)
Admission: EM | Admit: 2014-09-01 | Discharge: 2014-09-01 | Disposition: A | Discharge status: Home / Self Care | Payer: BC Managed Care – PPO | Source: Home / Self Care | Attending: Family Medicine | Admitting: Family Medicine

## 2014-09-01 ENCOUNTER — Encounter: Payer: Self-pay | Admitting: Emergency Medicine

## 2014-09-01 DIAGNOSIS — M722 Plantar fascial fibromatosis: Secondary | ICD-10-CM

## 2014-09-01 DIAGNOSIS — M7730 Calcaneal spur, unspecified foot: Secondary | ICD-10-CM

## 2014-09-01 NOTE — ED Notes (Signed)
Pt c/o RT foot pain x 1 month intermittently. Denies injury. No OTC meds.

## 2014-09-01 NOTE — ED Provider Notes (Signed)
Alexander Russo is a 33 y.o. male who presents to Urgent Care today for foot pain. Patient has a one-month history of right heel pain. The pain is worse with standing and better with rest. He notes pain at the plantar heel when he first stands up in the morning. He has not tried any medications yet. He suspects that he is plantar fasciitis. He denies any injury. The pain is moderate.   Past Medical History  Diagnosis Date  . Hypertension   . Obesity    History  Substance Use Topics  . Smoking status: Never Smoker   . Smokeless tobacco: Never Used  . Alcohol Use: Yes   ROS as above Medications: No current facility-administered medications for this encounter.   Current Outpatient Prescriptions  Medication Sig Dispense Refill  . chlorthalidone (HYGROTON) 50 MG tablet TAKE 2 TABLETS EVERY DAY  180 tablet  3  . lisinopril (PRINIVIL,ZESTRIL) 20 MG tablet Take 20 mg by mouth daily.      Marland Kitchen. albuterol (PROVENTIL HFA;VENTOLIN HFA) 108 (90 BASE) MCG/ACT inhaler Inhale into the lungs every 6 (six) hours as needed for wheezing or shortness of breath.      Marland Kitchen. amLODipine (NORVASC) 10 MG tablet Take 10 mg by mouth daily.      . chlorthalidone (HYGROTEN) 100 MG tablet Take 100 mg by mouth daily.      . potassium chloride SA (K-DUR,KLOR-CON) 20 MEQ tablet Take 20 mEq by mouth 2 (two) times daily.        Exam:  BP 167/94  Pulse 110  Temp(Src) 97.9 F (36.6 C) (Oral)  Resp 18  Ht 5\' 10"  (1.778 m)  Wt 386 lb (175.088 kg)  BMI 55.39 kg/m2  SpO2 97% Gen: Well NAD morbidly obese HEENT: EOMI,  MMM Lungs: Normal work of breathing. CTABL Heart: RRR no MRG Abd: NABS, Soft. Nondistended, Nontender Exts: Brisk capillary refill, warm and well perfused.  Foot: Right foot pes planus. Tender palpation medial plantar calcaneus. Normal foot motion.  No results found for this or any previous visit (from the past 24 hour(s)). Dg Os Calcis Right  09/01/2014   CLINICAL DATA:  Right heel pain.  No known injury.   EXAM: RIGHT OS CALCIS - 2+ VIEW  COMPARISON:  None.  FINDINGS: No acute bony or joint abnormality is identified. Small calcaneal spurs are seen. Soft tissue structures are unremarkable.  IMPRESSION: No acute finding.  Small calcaneal spur.   Electronically Signed   By: Drusilla Kannerhomas  Dalessio M.D.   On: 09/01/2014 16:39    Assessment and Plan: 33 y.o. male with plantar fasciitis. Likely related to obesity. Plan for heel pads and ice massage. Followup with primary care provider.  Discussed warning signs or symptoms. Please see discharge instructions. Patient expresses understanding.     Rodolph BongEvan S Corey, MD 09/01/14 1739

## 2014-09-01 NOTE — Discharge Instructions (Signed)
Thank you for coming in today. Use a heel pad.  Also do the ice massage we talked about.  Wear supportive shoes.  Follow up with Dr. Karie Schwalbe.    Plantar Fasciitis (Heel Spur Syndrome) with Rehab The plantar fascia is a fibrous, ligament-like, soft-tissue structure that spans the bottom of the foot. Plantar fasciitis is a condition that causes pain in the foot due to inflammation of the tissue. SYMPTOMS   Pain and tenderness on the underneath side of the foot.  Pain that worsens with standing or walking. CAUSES  Plantar fasciitis is caused by irritation and injury to the plantar fascia on the underneath side of the foot. Common mechanisms of injury include:  Direct trauma to bottom of the foot.  Damage to a small nerve that runs under the foot where the main fascia attaches to the heel bone.  Stress placed on the plantar fascia due to bone spurs. RISK INCREASES WITH:   Activities that place stress on the plantar fascia (running, jumping, pivoting, or cutting).  Poor strength and flexibility.  Improperly fitted shoes.  Tight calf muscles.  Flat feet.  Failure to warm-up properly before activity.  Obesity. PREVENTION  Warm up and stretch properly before activity.  Allow for adequate recovery between workouts.  Maintain physical fitness:  Strength, flexibility, and endurance.  Cardiovascular fitness.  Maintain a health body weight.  Avoid stress on the plantar fascia.  Wear properly fitted shoes, including arch supports for individuals who have flat feet. PROGNOSIS  If treated properly, then the symptoms of plantar fasciitis usually resolve without surgery. However, occasionally surgery is necessary. RELATED COMPLICATIONS   Recurrent symptoms that may result in a chronic condition.  Problems of the lower back that are caused by compensating for the injury, such as limping.  Pain or weakness of the foot during push-off following surgery.  Chronic inflammation,  scarring, and partial or complete fascia tear, occurring more often from repeated injections. TREATMENT  Treatment initially involves the use of ice and medication to help reduce pain and inflammation. The use of strengthening and stretching exercises may help reduce pain with activity, especially stretches of the Achilles tendon. These exercises may be performed at home or with a therapist. Your caregiver may recommend that you use heel cups of arch supports to help reduce stress on the plantar fascia. Occasionally, corticosteroid injections are given to reduce inflammation. If symptoms persist for greater than 6 months despite non-surgical (conservative), then surgery may be recommended.  MEDICATION   If pain medication is necessary, then nonsteroidal anti-inflammatory medications, such as aspirin and ibuprofen, or other minor pain relievers, such as acetaminophen, are often recommended.  Do not take pain medication within 7 days before surgery.  Prescription pain relievers may be given if deemed necessary by your caregiver. Use only as directed and only as much as you need.  Corticosteroid injections may be given by your caregiver. These injections should be reserved for the most serious cases, because they may only be given a certain number of times. HEAT AND COLD  Cold treatment (icing) relieves pain and reduces inflammation. Cold treatment should be applied for 10 to 15 minutes every 2 to 3 hours for inflammation and pain and immediately after any activity that aggravates your symptoms. Use ice packs or massage the area with a piece of ice (ice massage).  Heat treatment may be used prior to performing the stretching and strengthening activities prescribed by your caregiver, physical therapist, or athletic trainer. Use a heat pack  or soak the injury in warm water. SEEK IMMEDIATE MEDICAL CARE IF:  Treatment seems to offer no benefit, or the condition worsens.  Any medications produce adverse  side effects. EXERCISES RANGE OF MOTION (ROM) AND STRETCHING EXERCISES - Plantar Fasciitis (Heel Spur Syndrome) These exercises may help you when beginning to rehabilitate your injury. Your symptoms may resolve with or without further involvement from your physician, physical therapist or athletic trainer. While completing these exercises, remember:   Restoring tissue flexibility helps normal motion to return to the joints. This allows healthier, less painful movement and activity.  An effective stretch should be held for at least 30 seconds.  A stretch should never be painful. You should only feel a gentle lengthening or release in the stretched tissue. RANGE OF MOTION - Toe Extension, Flexion  Sit with your right / left leg crossed over your opposite knee.  Grasp your toes and gently pull them back toward the top of your foot. You should feel a stretch on the bottom of your toes and/or foot.  Hold this stretch for __________ seconds.  Now, gently pull your toes toward the bottom of your foot. You should feel a stretch on the top of your toes and or foot.  Hold this stretch for __________ seconds. Repeat __________ times. Complete this stretch __________ times per day.  RANGE OF MOTION - Ankle Dorsiflexion, Active Assisted  Remove shoes and sit on a chair that is preferably not on a carpeted surface.  Place right / left foot under knee. Extend your opposite leg for support.  Keeping your heel down, slide your right / left foot back toward the chair until you feel a stretch at your ankle or calf. If you do not feel a stretch, slide your bottom forward to the edge of the chair, while still keeping your heel down.  Hold this stretch for __________ seconds. Repeat __________ times. Complete this stretch __________ times per day.  STRETCH - Gastroc, Standing  Place hands on wall.  Extend right / left leg, keeping the front knee somewhat bent.  Slightly point your toes inward on your  back foot.  Keeping your right / left heel on the floor and your knee straight, shift your weight toward the wall, not allowing your back to arch.  You should feel a gentle stretch in the right / left calf. Hold this position for __________ seconds. Repeat __________ times. Complete this stretch __________ times per day. STRETCH - Soleus, Standing  Place hands on wall.  Extend right / left leg, keeping the other knee somewhat bent.  Slightly point your toes inward on your back foot.  Keep your right / left heel on the floor, bend your back knee, and slightly shift your weight over the back leg so that you feel a gentle stretch deep in your back calf.  Hold this position for __________ seconds. Repeat __________ times. Complete this stretch __________ times per day. STRETCH - Gastrocsoleus, Standing  Note: This exercise can place a lot of stress on your foot and ankle. Please complete this exercise only if specifically instructed by your caregiver.   Place the ball of your right / left foot on a step, keeping your other foot firmly on the same step.  Hold on to the wall or a rail for balance.  Slowly lift your other foot, allowing your body weight to press your heel down over the edge of the step.  You should feel a stretch in your right / left  calf.  Hold this position for __________ seconds.  Repeat this exercise with a slight bend in your right / left knee. Repeat __________ times. Complete this stretch __________ times per day.  STRENGTHENING EXERCISES - Plantar Fasciitis (Heel Spur Syndrome)  These exercises may help you when beginning to rehabilitate your injury. They may resolve your symptoms with or without further involvement from your physician, physical therapist or athletic trainer. While completing these exercises, remember:   Muscles can gain both the endurance and the strength needed for everyday activities through controlled exercises.  Complete these exercises as  instructed by your physician, physical therapist or athletic trainer. Progress the resistance and repetitions only as guided. STRENGTH - Towel Curls  Sit in a chair positioned on a non-carpeted surface.  Place your foot on a towel, keeping your heel on the floor.  Pull the towel toward your heel by only curling your toes. Keep your heel on the floor.  If instructed by your physician, physical therapist or athletic trainer, add ____________________ at the end of the towel. Repeat __________ times. Complete this exercise __________ times per day. STRENGTH - Ankle Inversion  Secure one end of a rubber exercise band/tubing to a fixed object (table, pole). Loop the other end around your foot just before your toes.  Place your fists between your knees. This will focus your strengthening at your ankle.  Slowly, pull your big toe up and in, making sure the band/tubing is positioned to resist the entire motion.  Hold this position for __________ seconds.  Have your muscles resist the band/tubing as it slowly pulls your foot back to the starting position. Repeat __________ times. Complete this exercises __________ times per day.  Document Released: 11/19/2005 Document Revised: 02/11/2012 Document Reviewed: 03/03/2009 [facility] Patient Information 2015 [place], [place]. This information is not intended to replace advice given to you by your health care provider. Make sure you discuss any questions you have with your health care provider.

## 2014-10-05 ENCOUNTER — Encounter: Payer: Self-pay | Admitting: Sports Medicine

## 2014-10-06 MED ORDER — AZITHROMYCIN 250 MG PO TABS: ORAL_TABLET | ORAL | Status: DC

## 2014-10-06 MED ORDER — FLUTICASONE PROPIONATE 50 MCG/ACT NA SUSP: NASAL | Status: DC

## 2014-10-06 MED ORDER — ALBUTEROL SULFATE HFA 108 (90 BASE) MCG/ACT IN AERS: 2.0000 | INHALATION_SPRAY | Freq: Four times a day (QID) | RESPIRATORY_TRACT | Status: DC | PRN

## 2014-11-06 ENCOUNTER — Encounter: Payer: Self-pay | Admitting: Sports Medicine

## 2015-06-17 ENCOUNTER — Other Ambulatory Visit: Payer: Self-pay

## 2015-06-17 MED ORDER — LISINOPRIL 20 MG PO TABS: 20.0000 mg | ORAL_TABLET | Freq: Every day | ORAL | Status: DC

## 2015-07-12 ENCOUNTER — Telehealth: Payer: Self-pay | Admitting: Sports Medicine

## 2015-07-12 ENCOUNTER — Encounter: Payer: Self-pay | Admitting: Sports Medicine

## 2015-07-12 ENCOUNTER — Ambulatory Visit (INDEPENDENT_AMBULATORY_CARE_PROVIDER_SITE_OTHER): Payer: BLUE CROSS/BLUE SHIELD | Admitting: Sports Medicine

## 2015-07-12 VITALS — BP 147/90 | HR 101 | Ht 70.0 in | Wt >= 6400 oz

## 2015-07-12 DIAGNOSIS — Z0000 Encounter for general adult medical examination without abnormal findings: Secondary | ICD-10-CM | POA: Diagnosis not present

## 2015-07-12 DIAGNOSIS — R740 Nonspecific elevation of levels of transaminase and lactic acid dehydrogenase [LDH]: Secondary | ICD-10-CM

## 2015-07-12 DIAGNOSIS — I10 Essential (primary) hypertension: Secondary | ICD-10-CM

## 2015-07-12 DIAGNOSIS — G4733 Obstructive sleep apnea (adult) (pediatric): Secondary | ICD-10-CM | POA: Diagnosis not present

## 2015-07-12 DIAGNOSIS — R7401 Elevation of levels of liver transaminase levels: Secondary | ICD-10-CM

## 2015-07-12 DIAGNOSIS — E669 Obesity, unspecified: Secondary | ICD-10-CM | POA: Diagnosis not present

## 2015-07-12 MED ORDER — LIRAGLUTIDE -WEIGHT MANAGEMENT 18 MG/3ML ~~LOC~~ SOPN: 3.0000 mg | PEN_INJECTOR | Freq: Every day | SUBCUTANEOUS | Status: DC

## 2015-07-12 MED ORDER — LIRAGLUTIDE 18 MG/3ML ~~LOC~~ SOPN: PEN_INJECTOR | SUBCUTANEOUS | Status: DC

## 2015-07-12 NOTE — Progress Notes (Signed)
  Subjective:    CC: Complete physical.   HPI:  Preventive measures: Up-to-date  Obesity: Has not yet seen bariatric surgery, amenable to try an additional trial of injectable weight loss medications  Hypertension: Still elevated, no visual changes, chest pain, stable on current medications.  Sleep apnea: Significant snoring through the night, excessive daytime sleepiness, propping the head of his bed up has helped minimally. He has never had a sleep study or used his CPAP device.   Past medical history, Surgical history, Family history not pertinant except as noted below, Social history, Allergies, and medications have been entered into the medical record, reviewed, and no changes needed.   Review of Systems: No headache, visual changes, nausea, vomiting, diarrhea, constipation, dizziness, abdominal pain, skin rash, fevers, chills, night sweats, swollen lymph nodes, weight loss, chest pain, body aches, joint swelling, muscle aches, shortness of breath, mood changes, visual or auditory hallucinations.  Objective:    General: Well Developed, well nourished, and in no acute distress.  Neuro: Alert and oriented x3, extra-ocular muscles intact, sensation grossly intact. Cranial nerves II through XII are intact, motor, sensory, and coordinative functions are all intact. HEENT: Normocephalic, atraumatic, pupils equal round reactive to light, neck supple, no masses, no lymphadenopathy, thyroid nonpalpable. Oropharynx, nasopharynx, external ear canals are unremarkable. Skin: Warm and dry, no rashes noted.  Cardiac: Regular rate and rhythm, no murmurs rubs or gallops.  Respiratory: Clear to auscultation bilaterally. Not using accessory muscles, speaking in full sentences.  Abdominal: Soft, nontender, nondistended, positive bowel sounds, no masses, no organomegaly.  Musculoskeletal: Shoulder, elbow, wrist, hip, knee, ankle stable, and with full range of motion.  Impression and Recommendations:     The patient was counselled, risk factors were discussed, anticipatory guidance given.  I spent 25 minutes with this patient, greater than 50% was face-to-face time counseling regarding the above diagnoses, this time was separate from the time spent performing the physical examination.

## 2015-07-12 NOTE — Assessment & Plan Note (Signed)
Physical as above, routine blood work ordered. 

## 2015-07-12 NOTE — Telephone Encounter (Signed)
Saxenda denied, switching to victoza, please tell patient this and have him switch to Victoza and print a discount coupon online.

## 2015-07-12 NOTE — Assessment & Plan Note (Signed)
Ordering a sleep study

## 2015-07-12 NOTE — Assessment & Plan Note (Signed)
Did not follow-up with bariatric surgery, I am going to start Saxenda and we are going to try the referral again. He will return in a nurse visit to learn how to use the pens.

## 2015-07-12 NOTE — Assessment & Plan Note (Signed)
Blood pressure is a bit elevated but I am not going to go up on lisinopril for now, we will probably see an improvement as he loses weight.

## 2015-07-14 NOTE — Telephone Encounter (Signed)
LEFT A DETAILED MESSAGE ON PATIENT VM WITH INFORMATION AS NOTED BELOW. Rhonda Cunningham,CMA

## 2015-07-27 ENCOUNTER — Other Ambulatory Visit: Payer: Self-pay | Admitting: Sports Medicine

## 2015-08-20 ENCOUNTER — Other Ambulatory Visit: Payer: Self-pay | Admitting: Sports Medicine

## 2015-09-03 ENCOUNTER — Other Ambulatory Visit: Payer: Self-pay | Admitting: Sports Medicine

## 2015-10-14 IMAGING — CR DG OS CALCIS 2+V*R*
3 series · 3 of 3 positions shown · non-contrast
Comparison: None.

CLINICAL DATA: Right heel pain.  No known injury.

EXAM:
RIGHT OS CALCIS - 2+ VIEW

[view not recorded (1 of 3)]
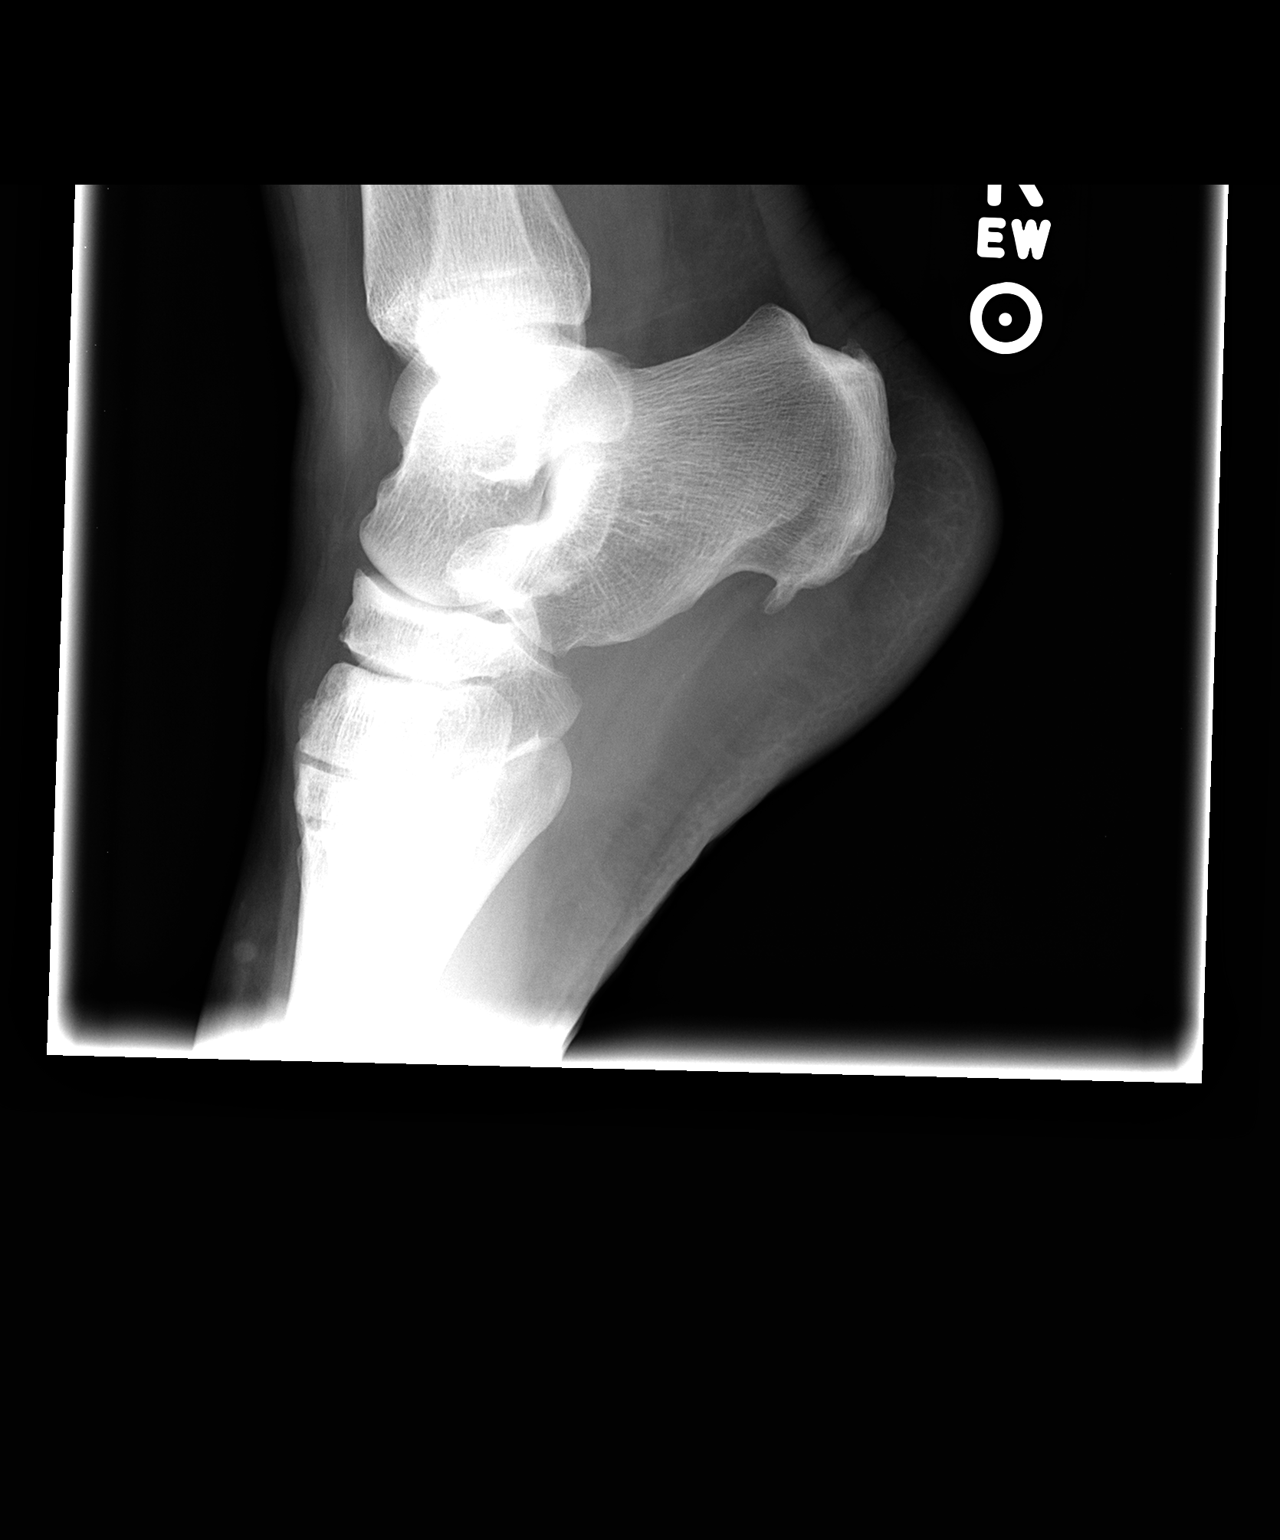

[view not recorded (2 of 3)]
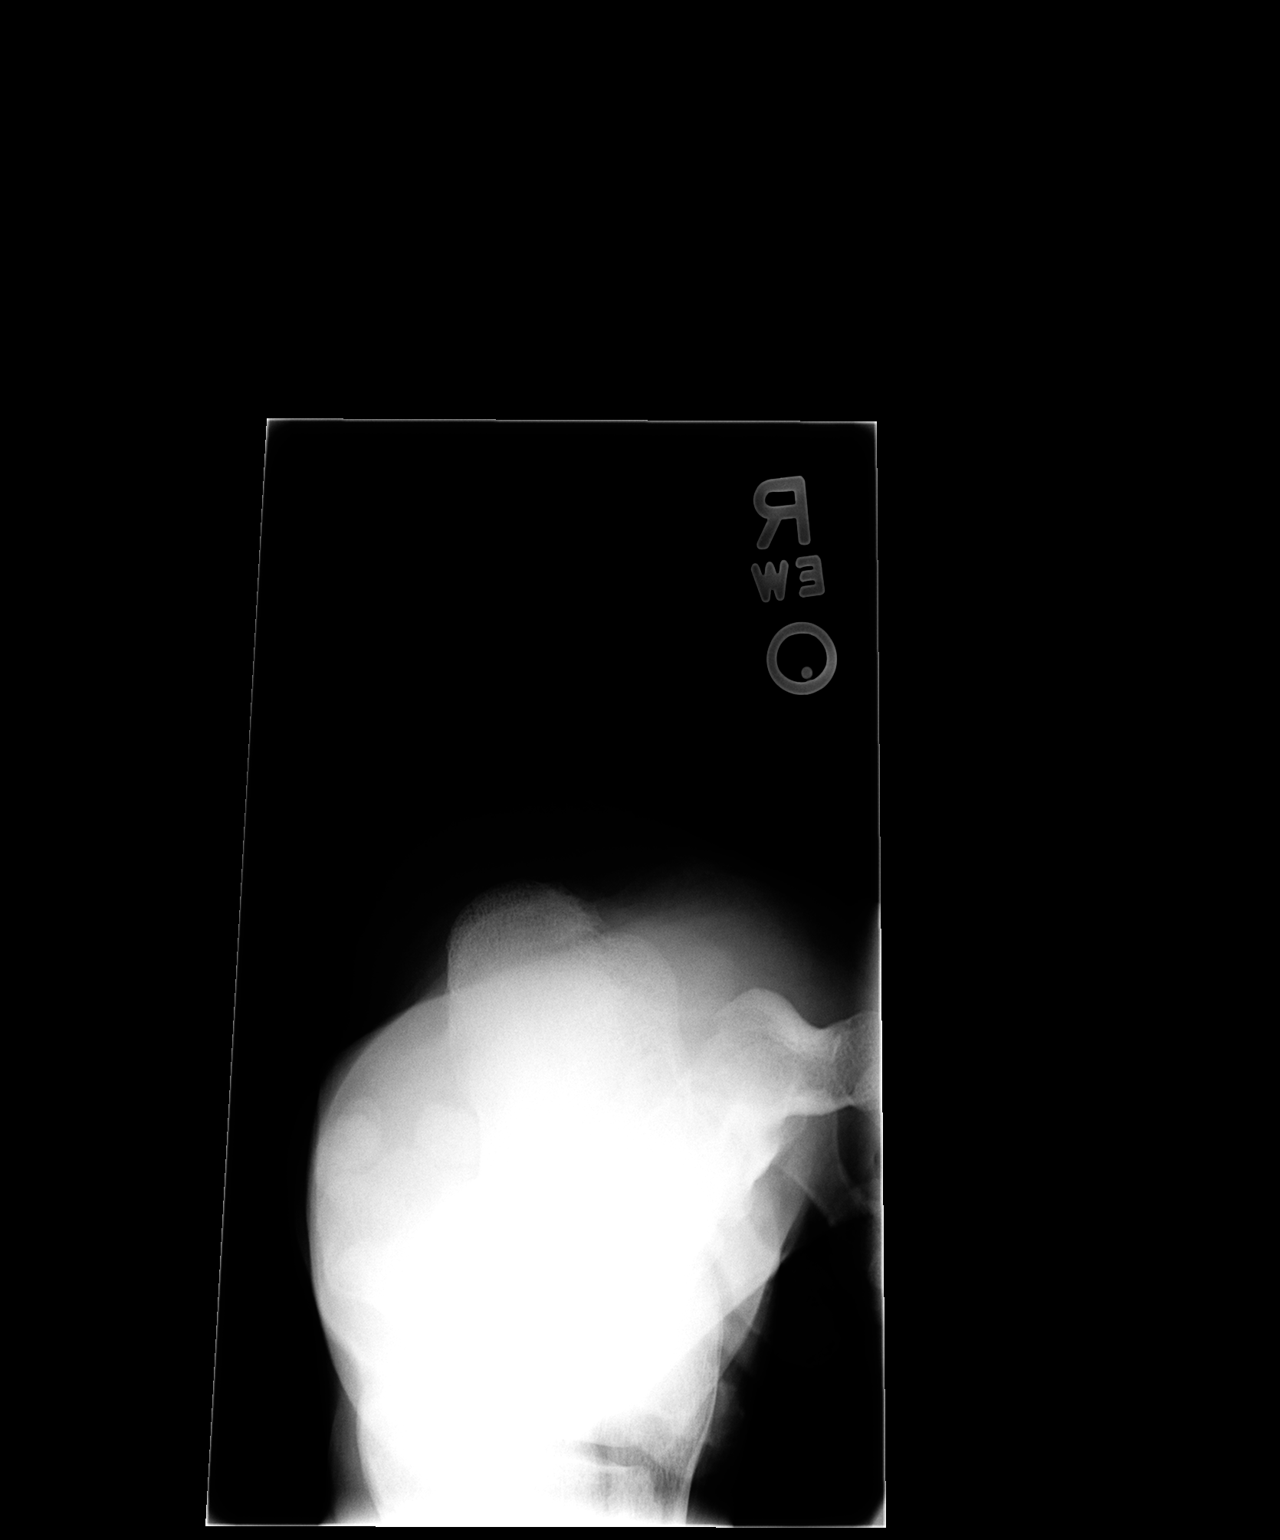

[view not recorded (3 of 3)]
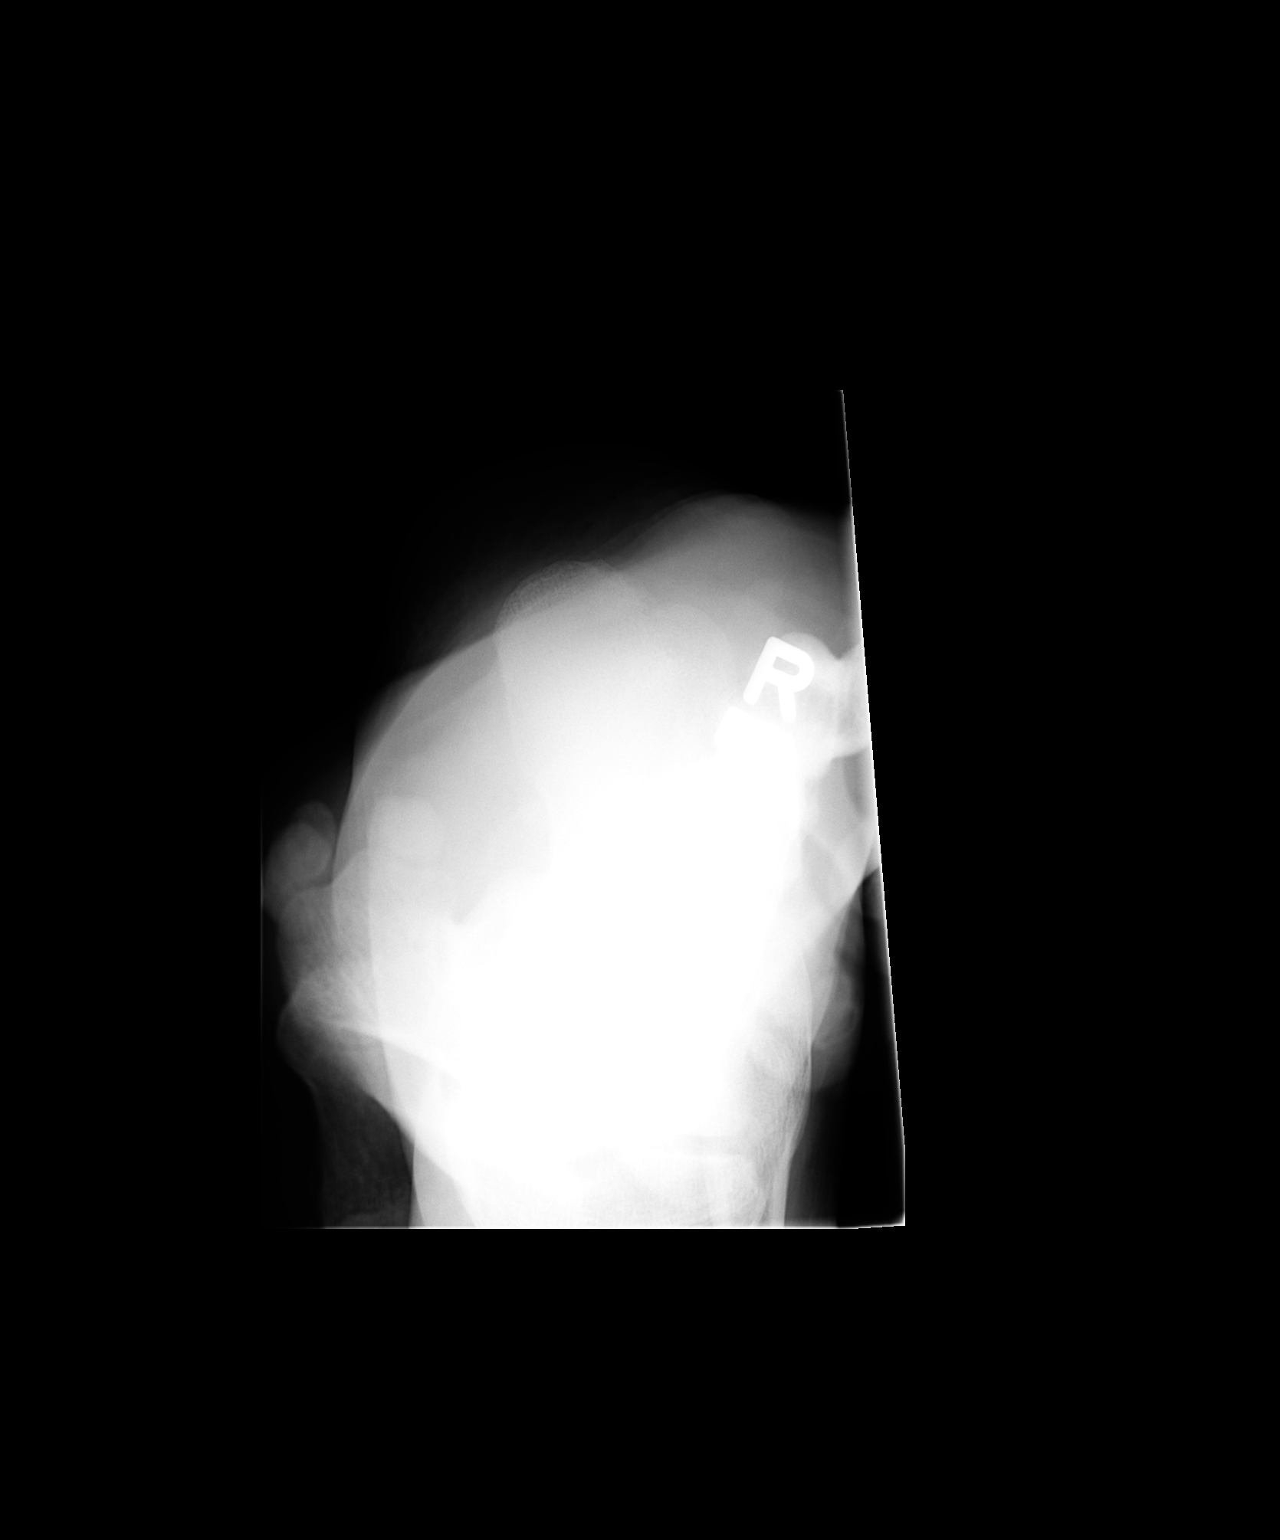

[3 of 3 positions shown; findings below may reference images not displayed]

FINDINGS: No acute bony or joint abnormality is identified. Small calcaneal
spurs are seen. Soft tissue structures are unremarkable..
IMPRESSION: No acute finding.  Small calcaneal spur.

## 2015-10-17 ENCOUNTER — Other Ambulatory Visit: Payer: Self-pay | Admitting: Sports Medicine

## 2015-10-17 ENCOUNTER — Encounter: Payer: Self-pay | Admitting: Sports Medicine

## 2015-10-18 MED ORDER — LISINOPRIL 20 MG PO TABS: ORAL_TABLET | ORAL | Status: DC

## 2015-10-18 MED ORDER — CHLORTHALIDONE 50 MG PO TABS: 100.0000 mg | ORAL_TABLET | Freq: Every day | ORAL | Status: DC

## 2015-10-20 DIAGNOSIS — R7401 Elevation of levels of liver transaminase levels: Secondary | ICD-10-CM | POA: Insufficient documentation

## 2015-10-20 DIAGNOSIS — R740 Nonspecific elevation of levels of transaminase and lactic acid dehydrogenase [LDH]: Secondary | ICD-10-CM

## 2015-10-20 LAB — CBC
HCT: 44.2 % (ref 39.0–52.0)
Hemoglobin: 15.1 g/dL (ref 13.0–17.0)
MCH: 30 pg (ref 26.0–34.0)
MCHC: 34.2 g/dL (ref 30.0–36.0)
MCV: 87.7 fL (ref 78.0–100.0)
MPV: 10.7 fL (ref 8.6–12.4)
Platelets: 302 10*3/uL (ref 150–400)
RBC: 5.04 MIL/uL (ref 4.22–5.81)
RDW: 15.5 % (ref 11.5–15.5)
WBC: 8.5 10*3/uL (ref 4.0–10.5)

## 2015-10-20 LAB — COMPREHENSIVE METABOLIC PANEL
ALT: 122 U/L — ABNORMAL HIGH (ref 9–46)
AST: 75 U/L — ABNORMAL HIGH (ref 10–40)
BUN: 14 mg/dL (ref 7–25)
CO2: 32 mmol/L — ABNORMAL HIGH (ref 20–31)
Calcium: 9.1 mg/dL (ref 8.6–10.3)
Chloride: 96 mmol/L — ABNORMAL LOW (ref 98–110)
Creat: 0.63 mg/dL (ref 0.60–1.35)
Glucose, Bld: 113 mg/dL — ABNORMAL HIGH (ref 65–99)
Potassium: 3.5 mmol/L (ref 3.5–5.3)
Sodium: 136 mmol/L (ref 135–146)
Total Bilirubin: 0.8 mg/dL (ref 0.2–1.2)

## 2015-10-20 LAB — LIPID PANEL
Cholesterol: 149 mg/dL (ref 125–200)
HDL: 27 mg/dL — ABNORMAL LOW (ref >=40)
LDL Cholesterol: 98 mg/dL (ref <130)
Total CHOL/HDL Ratio: 5.5 Ratio — ABNORMAL HIGH (ref <=5.0)
Triglycerides: 121 mg/dL (ref <150)
VLDL: 24 mg/dL (ref <30)

## 2015-10-20 LAB — COMPREHENSIVE METABOLIC PANEL WITH GFR
Albumin: 3.7 g/dL (ref 3.6–5.1)
Alkaline Phosphatase: 62 U/L (ref 40–115)
Total Protein: 6.3 g/dL (ref 6.1–8.1)

## 2015-10-20 LAB — HEMOGLOBIN A1C
Hgb A1c MFr Bld: 6.1 % — ABNORMAL HIGH (ref <5.7)
Mean Plasma Glucose: 128 mg/dL — ABNORMAL HIGH (ref <117)

## 2015-10-20 LAB — BRAIN NATRIURETIC PEPTIDE: Brain Natriuretic Peptide: 2.6 pg/mL (ref 0.0–100.0)

## 2015-10-20 LAB — TSH: TSH: 3.702 u[IU]/mL (ref 0.350–4.500)

## 2015-10-20 NOTE — Assessment & Plan Note (Signed)
Likely secondary to steatohepatitis, rechecking in 2 weeks.

## 2015-10-20 NOTE — Addendum Note (Signed)
Addended by: Monica BectonHEKKEKANDAM, THOMAS J on: 10/20/2015 02:15 PM   Modules accepted: Orders

## 2015-11-23 ENCOUNTER — Encounter: Payer: Self-pay | Admitting: Sports Medicine

## 2015-12-10 ENCOUNTER — Other Ambulatory Visit: Payer: Self-pay | Admitting: Sports Medicine

## 2016-01-02 ENCOUNTER — Encounter: Payer: Self-pay | Admitting: Sports Medicine

## 2016-01-02 MED ORDER — ALBUTEROL SULFATE HFA 108 (90 BASE) MCG/ACT IN AERS: 2.0000 | INHALATION_SPRAY | Freq: Four times a day (QID) | RESPIRATORY_TRACT | Status: DC | PRN

## 2016-02-17 ENCOUNTER — Other Ambulatory Visit: Payer: Self-pay | Admitting: Sports Medicine

## 2016-03-20 ENCOUNTER — Telehealth: Payer: Self-pay | Admitting: Sports Medicine

## 2016-03-20 NOTE — Telephone Encounter (Signed)
I called pt and left a message for pt to call the office for a f/u on BP with Dr.Thekkekandam

## 2016-03-27 ENCOUNTER — Encounter: Payer: Self-pay | Admitting: Sports Medicine

## 2016-03-27 ENCOUNTER — Other Ambulatory Visit: Payer: Self-pay | Admitting: Sports Medicine

## 2016-03-28 ENCOUNTER — Other Ambulatory Visit: Payer: Self-pay | Admitting: Sports Medicine

## 2016-03-29 ENCOUNTER — Other Ambulatory Visit: Payer: Self-pay

## 2016-03-29 MED ORDER — CHLORTHALIDONE 50 MG PO TABS: 100.0000 mg | ORAL_TABLET | Freq: Every day | ORAL | Status: DC

## 2016-04-03 ENCOUNTER — Ambulatory Visit (INDEPENDENT_AMBULATORY_CARE_PROVIDER_SITE_OTHER): Payer: 59 | Admitting: Sports Medicine

## 2016-04-03 ENCOUNTER — Encounter: Payer: Self-pay | Admitting: Sports Medicine

## 2016-04-03 VITALS — BP 137/81 | HR 81 | Resp 18 | Ht 70.0 in | Wt >= 6400 oz

## 2016-04-03 DIAGNOSIS — I10 Essential (primary) hypertension: Secondary | ICD-10-CM | POA: Diagnosis not present

## 2016-04-03 DIAGNOSIS — E669 Obesity, unspecified: Secondary | ICD-10-CM | POA: Diagnosis not present

## 2016-04-03 DIAGNOSIS — L739 Follicular disorder, unspecified: Secondary | ICD-10-CM

## 2016-04-03 MED ORDER — DOXYCYCLINE HYCLATE 100 MG PO TABS: 100.0000 mg | ORAL_TABLET | Freq: Two times a day (BID) | ORAL | Status: AC

## 2016-04-03 MED ORDER — LISINOPRIL 20 MG PO TABS: 20.0000 mg | ORAL_TABLET | Freq: Every day | ORAL | Status: DC

## 2016-04-03 MED ORDER — LIRAGLUTIDE -WEIGHT MANAGEMENT 18 MG/3ML ~~LOC~~ SOPN: 3.0000 mg | PEN_INJECTOR | Freq: Every day | SUBCUTANEOUS | Status: DC

## 2016-04-03 MED ORDER — MUPIROCIN 2 % EX OINT: TOPICAL_OINTMENT | CUTANEOUS | Status: DC

## 2016-04-03 MED ORDER — CHLORTHALIDONE 50 MG PO TABS: 100.0000 mg | ORAL_TABLET | Freq: Every day | ORAL | Status: DC

## 2016-04-03 NOTE — Addendum Note (Signed)
Addended by: Pixie CasinoUNNINGHAM, RHONDA C on: 04/03/2016 03:21 PM   Modules accepted: Orders

## 2016-04-03 NOTE — Assessment & Plan Note (Signed)
Controlled, no changes. 

## 2016-04-03 NOTE — Assessment & Plan Note (Signed)
Doxycycline for 7 days, Bactroban ointment.

## 2016-04-03 NOTE — Assessment & Plan Note (Signed)
Restarting Saxenda, has new insurance without an obesity exclusion.

## 2016-04-03 NOTE — Progress Notes (Signed)
  Subjective:    CC: CPE   HPI:  Obesity: Ready to start Saxenda.  CPE:  No questions.  Skin lesions:  Boils on lower abdomen.  Past medical history, Surgical history, Family history not pertinant except as noted below, Social history, Allergies, and medications have been entered into the medical record, reviewed, and no changes needed.   Review of Systems: No headache, visual changes, nausea, vomiting, diarrhea, constipation, dizziness, abdominal pain, skin rash, fevers, chills, night sweats, swollen lymph nodes, weight loss, chest pain, body aches, joint swelling, muscle aches, shortness of breath, mood changes, visual or auditory hallucinations.  Objective:    General: Well Developed, well nourished, and in no acute distress.  Neuro: Alert and oriented x3, extra-ocular muscles intact, sensation grossly intact. Cranial nerves II through XII are intact, motor, sensory, and coordinative functions are all intact. HEENT: Normocephalic, atraumatic, pupils equal round reactive to light, neck supple, no masses, no lymphadenopathy, thyroid nonpalpable. Oropharynx, nasopharynx, external ear canals are unremarkable. Skin: Warm and dry, no rashes noted.  Cardiac: Regular rate and rhythm, no murmurs rubs or gallops.  Respiratory: Clear to auscultation bilaterally. Not using accessory muscles, speaking in full sentences.  Abdominal: Soft, nontender, nondistended, positive bowel sounds, no masses, no organomegaly. There are several small boils, nonfluctuant as well as evidence of folliculitis on the lower abdomen. Musculoskeletal: Shoulder, elbow, wrist, hip, knee, ankle stable, and with full range of motion.  Impression and Recommendations:    The patient was counselled, risk factors were discussed, anticipatory guidance given.

## 2016-04-03 NOTE — Addendum Note (Signed)
Addended by: Pixie CasinoUNNINGHAM, RHONDA C on: 04/03/2016 03:22 PM   Modules accepted: Orders

## 2016-04-04 NOTE — Telephone Encounter (Signed)
Lisinopril was sent ton 04/03/2016. Rhonda Cunningham,CMA

## 2016-04-05 ENCOUNTER — Encounter: Payer: Self-pay | Admitting: Sports Medicine

## 2016-04-12 ENCOUNTER — Telehealth: Payer: Self-pay | Admitting: *Deleted

## 2016-04-12 NOTE — Telephone Encounter (Signed)
PA initiated for saxenda PA Case ID: [id number]

## 2016-04-12 NOTE — Telephone Encounter (Signed)
saxenda has been approved from 5/11/2017through 08/13/2016  mychart message sent to the patient

## 2016-05-18 ENCOUNTER — Other Ambulatory Visit: Payer: Self-pay | Admitting: Sports Medicine

## 2016-05-21 ENCOUNTER — Other Ambulatory Visit: Payer: Self-pay

## 2016-05-21 MED ORDER — INSULIN PEN NEEDLE 32G X 4 MM MISC: Status: DC

## 2016-06-06 ENCOUNTER — Encounter: Payer: 59 | Admitting: Sports Medicine

## 2016-07-02 ENCOUNTER — Other Ambulatory Visit: Payer: Self-pay | Admitting: Sports Medicine

## 2016-07-03 MED ORDER — CHLORTHALIDONE 50 MG PO TABS: 100.0000 mg | ORAL_TABLET | Freq: Every day | ORAL | 1 refills | Status: DC

## 2016-07-28 ENCOUNTER — Other Ambulatory Visit: Payer: Self-pay | Admitting: Sports Medicine

## 2016-11-06 ENCOUNTER — Other Ambulatory Visit: Payer: Self-pay | Admitting: Sports Medicine

## 2017-03-03 ENCOUNTER — Other Ambulatory Visit: Payer: Self-pay | Admitting: Sports Medicine

## 2017-06-13 ENCOUNTER — Encounter: Payer: Self-pay | Admitting: Sports Medicine

## 2017-06-27 ENCOUNTER — Other Ambulatory Visit: Payer: Self-pay | Admitting: Sports Medicine

## 2017-08-15 ENCOUNTER — Other Ambulatory Visit: Payer: Self-pay | Admitting: Sports Medicine

## 2017-08-15 ENCOUNTER — Encounter: Payer: Self-pay | Admitting: Sports Medicine

## 2017-08-22 ENCOUNTER — Other Ambulatory Visit: Payer: Self-pay | Admitting: Sports Medicine

## 2017-08-22 ENCOUNTER — Other Ambulatory Visit: Payer: Self-pay

## 2017-08-22 DIAGNOSIS — E038 Other specified hypothyroidism: Secondary | ICD-10-CM

## 2017-08-22 DIAGNOSIS — E039 Hypothyroidism, unspecified: Secondary | ICD-10-CM

## 2017-08-22 LAB — CBC
HCT: 44 % (ref 38.5–50.0)
Hemoglobin: 15.2 g/dL (ref 13.2–17.1)
MCH: 30 pg (ref 27.0–33.0)
MCHC: 34.5 g/dL (ref 32.0–36.0)
MCV: 86.8 fL (ref 80.0–100.0)
MPV: 10.7 fL (ref 7.5–12.5)
Platelets: 338 10*3/uL (ref 140–400)
RBC: 5.07 Million/uL (ref 4.20–5.80)
RDW: 14.3 % (ref 11.0–15.0)
WBC: 9.9 10*3/uL (ref 3.8–10.8)

## 2017-08-22 LAB — COMPREHENSIVE METABOLIC PANEL WITH GFR
AG Ratio: 1.3 (calc) (ref 1.0–2.5)
ALT: 31 U/L (ref 9–46)
AST: 20 U/L (ref 10–40)
Alkaline phosphatase (APISO): 70 U/L (ref 40–115)
CO2: 33 mmol/L — ABNORMAL HIGH (ref 20–32)
Calcium: 9 mg/dL (ref 8.6–10.3)
Chloride: 97 mmol/L — ABNORMAL LOW (ref 98–110)
Globulin: 3.2 g/dL (ref 1.9–3.7)
Glucose, Bld: 86 mg/dL (ref 65–99)
Total Bilirubin: 0.6 mg/dL (ref 0.2–1.2)

## 2017-08-22 LAB — LIPID PANEL W/REFLEX DIRECT LDL
Cholesterol: 134 mg/dL (ref <200)
HDL: 33 mg/dL — ABNORMAL LOW (ref >40)
LDL Cholesterol (Calc): 84 mg/dL (calc)
Non-HDL Cholesterol (Calc): 101 mg/dL (calc) (ref <130)
Total CHOL/HDL Ratio: 4.1 (calc) (ref <5.0)
Triglycerides: 76 mg/dL (ref <150)

## 2017-08-22 LAB — COMPREHENSIVE METABOLIC PANEL
Albumin: 4 g/dL (ref 3.6–5.1)
BUN: 16 mg/dL (ref 7–25)
Creat: 0.77 mg/dL (ref 0.60–1.35)
Potassium: 3.6 mmol/L (ref 3.5–5.3)
Sodium: 138 mmol/L (ref 135–146)
Total Protein: 7.2 g/dL (ref 6.1–8.1)

## 2017-08-22 LAB — HEMOGLOBIN A1C
Hgb A1c MFr Bld: 5.1 % of total Hgb (ref <5.7)
Mean Plasma Glucose: 100 (calc)
eAG (mmol/L): 5.5 (calc)

## 2017-08-22 LAB — HIV ANTIBODY (ROUTINE TESTING W REFLEX): HIV 1&2 Ab, 4th Generation: NONREACTIVE

## 2017-08-22 LAB — TSH: TSH: 14.75 mIU/L — ABNORMAL HIGH (ref 0.40–4.50)

## 2017-08-22 LAB — T3, FREE: T3, Free: 2.9 pg/mL (ref 2.3–4.2)

## 2017-08-22 LAB — VITAMIN D 25 HYDROXY (VIT D DEFICIENCY, FRACTURES): Vit D, 25-Hydroxy: 26 ng/mL — ABNORMAL LOW (ref 30–100)

## 2017-08-22 LAB — T4, FREE: Free T4: 1.1 ng/dL (ref 0.8–1.8)

## 2017-08-22 MED ORDER — VITAMIN D (ERGOCALCIFEROL) 1.25 MG (50000 UNIT) PO CAPS: 50000.0000 [IU] | ORAL_CAPSULE | ORAL | 0 refills | Status: DC

## 2017-08-22 NOTE — Assessment & Plan Note (Signed)
Elevated TSH, adding T3 and T4 levels

## 2017-08-23 ENCOUNTER — Ambulatory Visit (INDEPENDENT_AMBULATORY_CARE_PROVIDER_SITE_OTHER): Payer: BLUE CROSS/BLUE SHIELD | Admitting: Sports Medicine

## 2017-08-23 ENCOUNTER — Encounter: Payer: Self-pay | Admitting: Sports Medicine

## 2017-08-23 DIAGNOSIS — R740 Nonspecific elevation of levels of transaminase and lactic acid dehydrogenase [LDH]: Secondary | ICD-10-CM

## 2017-08-23 DIAGNOSIS — Z0000 Encounter for general adult medical examination without abnormal findings: Secondary | ICD-10-CM

## 2017-08-23 DIAGNOSIS — I10 Essential (primary) hypertension: Secondary | ICD-10-CM | POA: Diagnosis not present

## 2017-08-23 DIAGNOSIS — R7401 Elevation of levels of liver transaminase levels: Secondary | ICD-10-CM

## 2017-08-23 DIAGNOSIS — E039 Hypothyroidism, unspecified: Secondary | ICD-10-CM | POA: Diagnosis not present

## 2017-08-23 DIAGNOSIS — IMO0001 Reserved for inherently not codable concepts without codable children: Secondary | ICD-10-CM

## 2017-08-23 DIAGNOSIS — E669 Obesity, unspecified: Secondary | ICD-10-CM | POA: Diagnosis not present

## 2017-08-23 DIAGNOSIS — E038 Other specified hypothyroidism: Secondary | ICD-10-CM

## 2017-08-23 MED ORDER — ALBUTEROL SULFATE HFA 108 (90 BASE) MCG/ACT IN AERS: 2.0000 | INHALATION_SPRAY | Freq: Four times a day (QID) | RESPIRATORY_TRACT | 11 refills | Status: DC | PRN

## 2017-08-23 MED ORDER — INSULIN PEN NEEDLE 32G X 4 MM MISC: 99 refills | Status: DC

## 2017-08-23 MED ORDER — LEVOTHYROXINE SODIUM 50 MCG PO TABS: 50.0000 ug | ORAL_TABLET | Freq: Every day | ORAL | 3 refills | Status: DC

## 2017-08-23 NOTE — Progress Notes (Signed)
  Subjective:    CC: Annual physical  HPI:  Alexander Russo returns, he's here for a physical, we will over his blood work. I did discover a high TSH in the 14's with normal T3 and T4 consistent with subclinical hypothyroidism, we've already started his low-dose levothyroxine.  Obesity: Good weight loss on Saxenda.  Hypertension: Well controlled.  Transaminitis: Resolved with weight loss.  Past medical history:  Negative.  See flowsheet/record as well for more information.  Surgical history: Negative.  See flowsheet/record as well for more information.  Family history: Negative.  See flowsheet/record as well for more information.  Social history: Negative.  See flowsheet/record as well for more information.  Allergies, and medications have been entered into the medical record, reviewed, and no changes needed.    Review of Systems: No headache, visual changes, nausea, vomiting, diarrhea, constipation, dizziness, abdominal pain, skin rash, fevers, chills, night sweats, swollen lymph nodes, weight loss, chest pain, body aches, joint swelling, muscle aches, shortness of breath, mood changes, visual or auditory hallucinations.  Objective:    General: Well Developed, well nourished, and in no acute distress.  Neuro: Alert and oriented x3, extra-ocular muscles intact, sensation grossly intact.  HEENT: Normocephalic, atraumatic, pupils equal round reactive to light, neck supple, no masses, no lymphadenopathy, thyroid nonpalpable.  Skin: Warm and dry, no rashes noted.  Cardiac: Regular rate and rhythm, no murmurs rubs or gallops.  Respiratory: Clear to auscultation bilaterally. Not using accessory muscles, speaking in full sentences.  Abdominal: Soft, nontender, nondistended, positive bowel sounds, no masses, no organomegaly.  Musculoskeletal: Shoulder, elbow, wrist, hip, knee, ankle stable, and with full range of motion.  Impression and Recommendations:    The patient was counselled, risk factors  were discussed, anticipatory guidance given.  Annual physical exam Annual exam today, patient declines to shot today but he will get at a different date. Labs look good.  Essential hypertension, benign Well-controlled, no changes needed.  Obesity Good weight loss with Saxenda. Refilling pen needles.  Subclinical hypothyroidism Treating with levothyroxine, recheck in 4-6 weeks.  Transaminitis Resolved with weight loss on Saxenda.  ___________________________________________ Alexander Russo. Alexander Russo, M.D., ABFM., CAQSM. Primary Care and Sports Medicine [facility]  Adjunct Instructor of Family Medicine  [employer]

## 2017-08-23 NOTE — Assessment & Plan Note (Signed)
T3 and T4 are normal, TSH is high suggesting subclinical hypothyroidism, this is best treated with low-dose levothyroxine, 50 g with a recheck in 6 weeks. I'm going to call this in, recheck TSH in 6 weeks. Orders placed.

## 2017-08-23 NOTE — Assessment & Plan Note (Signed)
Annual exam today, patient declines to shot today but he will get at a different date. Labs look good.

## 2017-08-23 NOTE — Assessment & Plan Note (Signed)
Treating with levothyroxine, recheck in 4-6 weeks.

## 2017-08-23 NOTE — Assessment & Plan Note (Signed)
Well-controlled, no changes needed 

## 2017-08-23 NOTE — Addendum Note (Signed)
Addended by: Monica Becton on: 08/23/2017 09:37 AM   Modules accepted: Orders

## 2017-08-23 NOTE — Assessment & Plan Note (Signed)
Resolved with weight loss on Saxenda.

## 2017-08-23 NOTE — Assessment & Plan Note (Signed)
Good weight loss with Saxenda. Refilling pen needles.

## 2017-09-26 ENCOUNTER — Other Ambulatory Visit: Payer: Self-pay | Admitting: Sports Medicine

## 2017-10-03 ENCOUNTER — Encounter: Payer: Self-pay | Admitting: Sports Medicine

## 2017-11-07 ENCOUNTER — Encounter: Payer: Self-pay | Admitting: Sports Medicine

## 2017-11-07 ENCOUNTER — Other Ambulatory Visit: Payer: Self-pay

## 2017-11-07 DIAGNOSIS — E038 Other specified hypothyroidism: Secondary | ICD-10-CM

## 2017-11-07 DIAGNOSIS — E039 Hypothyroidism, unspecified: Secondary | ICD-10-CM

## 2017-11-07 MED ORDER — LEVOTHYROXINE SODIUM 50 MCG PO TABS: 50.0000 ug | ORAL_TABLET | Freq: Every day | ORAL | 3 refills | Status: DC

## 2017-12-02 ENCOUNTER — Other Ambulatory Visit: Payer: Self-pay

## 2017-12-02 MED ORDER — CHLORTHALIDONE 50 MG PO TABS: 100.0000 mg | ORAL_TABLET | Freq: Every day | ORAL | 1 refills | Status: DC

## 2017-12-19 ENCOUNTER — Other Ambulatory Visit: Payer: Self-pay

## 2017-12-19 MED ORDER — LISINOPRIL 20 MG PO TABS: 20.0000 mg | ORAL_TABLET | Freq: Every day | ORAL | 2 refills | Status: DC

## 2018-01-04 ENCOUNTER — Other Ambulatory Visit: Payer: Self-pay | Admitting: Sports Medicine

## 2018-02-16 ENCOUNTER — Other Ambulatory Visit: Payer: Self-pay | Admitting: Sports Medicine

## 2018-03-01 ENCOUNTER — Other Ambulatory Visit: Payer: Self-pay | Admitting: Sports Medicine

## 2018-03-01 DIAGNOSIS — E039 Hypothyroidism, unspecified: Secondary | ICD-10-CM

## 2018-03-01 DIAGNOSIS — E038 Other specified hypothyroidism: Secondary | ICD-10-CM

## 2018-03-05 ENCOUNTER — Encounter: Payer: Self-pay | Admitting: Sports Medicine

## 2018-03-05 MED ORDER — OSELTAMIVIR PHOSPHATE 75 MG PO CAPS: 75.0000 mg | ORAL_CAPSULE | Freq: Every day | ORAL | 0 refills | Status: DC

## 2018-06-29 ENCOUNTER — Other Ambulatory Visit: Payer: Self-pay | Admitting: Sports Medicine

## 2018-06-30 ENCOUNTER — Other Ambulatory Visit: Payer: Self-pay | Admitting: Sports Medicine

## 2018-06-30 DIAGNOSIS — E039 Hypothyroidism, unspecified: Secondary | ICD-10-CM

## 2018-06-30 DIAGNOSIS — E038 Other specified hypothyroidism: Secondary | ICD-10-CM

## 2018-10-03 ENCOUNTER — Other Ambulatory Visit: Payer: Self-pay | Admitting: Sports Medicine

## 2018-10-08 ENCOUNTER — Encounter: Payer: Self-pay | Admitting: Sports Medicine

## 2019-01-06 DIAGNOSIS — M791 Myalgia, unspecified site: Secondary | ICD-10-CM | POA: Diagnosis not present

## 2019-01-06 DIAGNOSIS — J069 Acute upper respiratory infection, unspecified: Secondary | ICD-10-CM | POA: Diagnosis not present

## 2019-01-06 DIAGNOSIS — I10 Essential (primary) hypertension: Secondary | ICD-10-CM | POA: Diagnosis not present

## 2019-01-30 ENCOUNTER — Other Ambulatory Visit: Payer: Self-pay | Admitting: Sports Medicine

## 2019-01-30 DIAGNOSIS — E039 Hypothyroidism, unspecified: Secondary | ICD-10-CM

## 2019-01-30 DIAGNOSIS — E038 Other specified hypothyroidism: Secondary | ICD-10-CM

## 2019-01-30 MED ORDER — LEVOTHYROXINE SODIUM 50 MCG PO TABS: 50.0000 ug | ORAL_TABLET | Freq: Every day | ORAL | 0 refills | Status: DC

## 2019-03-12 ENCOUNTER — Other Ambulatory Visit: Payer: Self-pay

## 2019-03-12 ENCOUNTER — Ambulatory Visit (INDEPENDENT_AMBULATORY_CARE_PROVIDER_SITE_OTHER): Payer: Federal, State, Local not specified - PPO | Admitting: Sports Medicine

## 2019-03-12 ENCOUNTER — Encounter: Payer: Self-pay | Admitting: Sports Medicine

## 2019-03-12 DIAGNOSIS — I10 Essential (primary) hypertension: Secondary | ICD-10-CM | POA: Diagnosis not present

## 2019-03-12 DIAGNOSIS — E038 Other specified hypothyroidism: Secondary | ICD-10-CM

## 2019-03-12 DIAGNOSIS — Z0000 Encounter for general adult medical examination without abnormal findings: Secondary | ICD-10-CM | POA: Diagnosis not present

## 2019-03-12 DIAGNOSIS — E039 Hypothyroidism, unspecified: Secondary | ICD-10-CM | POA: Diagnosis not present

## 2019-03-12 DIAGNOSIS — E66813 Obesity, class 3: Secondary | ICD-10-CM

## 2019-03-12 DIAGNOSIS — E119 Type 2 diabetes mellitus without complications: Secondary | ICD-10-CM

## 2019-03-12 DIAGNOSIS — F320 Major depressive disorder, single episode, mild: Secondary | ICD-10-CM | POA: Insufficient documentation

## 2019-03-12 MED ORDER — LIRAGLUTIDE -WEIGHT MANAGEMENT 18 MG/3ML ~~LOC~~ SOPN: 3.0000 mg | PEN_INJECTOR | Freq: Every day | SUBCUTANEOUS | 2 refills | Status: DC

## 2019-03-12 MED ORDER — LISINOPRIL 20 MG PO TABS: 20.0000 mg | ORAL_TABLET | Freq: Every day | ORAL | 0 refills | Status: DC

## 2019-03-12 MED ORDER — CHLORTHALIDONE 50 MG PO TABS: 100.0000 mg | ORAL_TABLET | Freq: Every day | ORAL | 0 refills | Status: DC

## 2019-03-12 MED ORDER — SERTRALINE HCL 25 MG PO TABS: 25.0000 mg | ORAL_TABLET | Freq: Every day | ORAL | 2 refills | Status: DC

## 2019-03-12 NOTE — Assessment & Plan Note (Signed)
Rechecking TSH 

## 2019-03-12 NOTE — Assessment & Plan Note (Signed)
Starting Zoloft. 1 month virtual visit for PHQ/GAD.

## 2019-03-12 NOTE — Progress Notes (Addendum)
Virtual Visit via Telephone   I connected with  Alexander Russo  on 09/08/19 by telephone and verified that I am speaking with the correct person using two identifiers.   I discussed the limitations, risks, security and privacy concerns of performing an evaluation and management service by telephone, including the higher likelihood of inaccurate diagnosis and treatment, and the availability of in person appointments.  We also discussed the likely need of an additional face to face encounter for complete and high quality delivery of care.  I also discussed with the patient that there may be a patient responsible charge related to this service. The patient expressed understanding and wishes to proceed.  Subjective:    CC: Follow-up of medications, mood symptoms  HPI: This is a pleasant 38 year old male, I have not seen him in the last 2 years.  Hypertension: Needs a refill of medications, he was unable to check his blood pressures at home, he does plan to find his blood pressure cuff and will let me know the numbers ASAP.  Subclinical hypothyroidism: Due to recheck labs.  Obesity: Has stopped Saxenda, he would like to get started on this again.  Mood disorder: Has been working at home, he is somewhat anxious about the current situation, and the risk to his family.  He was tearful on the phone.  No suicidal or homicidal ideation.  He is agreeable and actually requested an SSRI.  I reviewed the past medical history, family history, social history, surgical history, and allergies today and no changes were needed.  Please see the problem list section below in epic for further details.  Past Medical History: Past Medical History:  Diagnosis Date  . Hypertension   . Obesity    Past Surgical History: Past Surgical History:  Procedure Laterality Date  . KNEE SURGERY    . TUMOR REMOVAL    . TYMPANOSTOMY TUBE PLACEMENT     Social History: Social History   Socioeconomic History  .  Marital status: Married    Spouse name: Not on file  . Number of children: Not on file  . Years of education: Not on file  . Highest education level: Not on file  Occupational History  . Not on file  Social Needs  . Financial resource strain: Not on file  . Food insecurity    Worry: Not on file    Inability: Not on file  . Transportation needs    Medical: Not on file    Non-medical: Not on file  Tobacco Use  . Smoking status: Never Smoker  . Smokeless tobacco: Never Used  Substance and Sexual Activity  . Alcohol use: Yes  . Drug use: No  . Sexual activity: Yes  Lifestyle  . Physical activity    Days per week: Not on file    Minutes per session: Not on file  . Stress: Not on file  Relationships  . Social Musician on phone: Not on file    Gets together: Not on file    Attends religious service: Not on file    Active member of club or organization: Not on file    Attends meetings of clubs or organizations: Not on file    Relationship status: Not on file  Other Topics Concern  . Not on file  Social History Narrative  . Not on file   Family History: Family History  Problem Relation Age of Onset  . Cancer Mother   . Depression Mother   .  Hypertension Mother   . Multiple sclerosis Mother   . Hypertension Father   . Depression Sister   . Hypertension Sister    Allergies: No Known Allergies Medications: See med rec.  Review of Systems: No fevers, chills, night sweats, weight loss, chest pain, or shortness of breath.   Objective:    General: Speaking full sentences, no audible heavy breathing.  Sounds alert and appropriately interactive.  No other physical exam performed due to the non-face to face nature of this visit.  Impression and Recommendations:    Annual physical exam Due for Tdap, will do it at a followup visit.  Essential hypertension, benign This is a virtual visit, he hasn't taken his blood pressure. Refilling medication, ordering  labs. He will check his blood pressure over the next few days and let me know.  Obesity Refilling saxenda.  Subclinical hypothyroidism Rechecking TSH  Depression, major, single episode, mild (HCC) Starting Zoloft. 1 month virtual visit for PHQ/GAD.   Controlled type 2 diabetes mellitus without complication, without long-term current use of insulin (HCC) Hemoglobin A1c is in the diabetic range, restarting Saxenda will help, vitamin D is low, calling in supplement.  Considering that A1c is in the diabetic range we could potentially switch to Ozempic which is once a week and has better weight loss if okay with him.  I discussed the above assessment and treatment plan with the patient. The patient was provided an opportunity to ask questions and all were answered. The patient agreed with the plan and demonstrated an understanding of the instructions.   The patient was advised to call back or seek an in-person evaluation if the symptoms worsen or if the condition fails to improve as anticipated.   I provided 21 minutes of non-face-to-face time during this encounter, less than 50% of this was time needed to gather information, review chart, records, and complete documentation.   ___________________________________________ Ihor Austinhomas J. Benjamin Stainhekkekandam, M.D., ABFM., CAQSM. Primary Care and Sports Medicine [facility]  Adjunct Professor of Family Medicine  [employer]

## 2019-03-12 NOTE — Assessment & Plan Note (Signed)
This is a virtual visit, he hasn't taken his blood pressure. Refilling medication, ordering labs. He will check his blood pressure over the next few days and let me know.

## 2019-03-12 NOTE — Assessment & Plan Note (Signed)
Due for Tdap, will do it at a followup visit.

## 2019-03-12 NOTE — Assessment & Plan Note (Signed)
Refilling saxenda.

## 2019-04-07 ENCOUNTER — Other Ambulatory Visit: Payer: Self-pay | Admitting: Sports Medicine

## 2019-04-07 DIAGNOSIS — F320 Major depressive disorder, single episode, mild: Secondary | ICD-10-CM

## 2019-06-18 ENCOUNTER — Other Ambulatory Visit: Payer: Self-pay | Admitting: Sports Medicine

## 2019-06-18 DIAGNOSIS — F320 Major depressive disorder, single episode, mild: Secondary | ICD-10-CM

## 2019-07-20 ENCOUNTER — Other Ambulatory Visit: Payer: Self-pay | Admitting: Sports Medicine

## 2019-07-20 DIAGNOSIS — F320 Major depressive disorder, single episode, mild: Secondary | ICD-10-CM

## 2019-07-28 ENCOUNTER — Other Ambulatory Visit: Payer: Self-pay | Admitting: Sports Medicine

## 2019-07-28 DIAGNOSIS — E039 Hypothyroidism, unspecified: Secondary | ICD-10-CM

## 2019-07-28 DIAGNOSIS — E038 Other specified hypothyroidism: Secondary | ICD-10-CM

## 2019-07-28 NOTE — Telephone Encounter (Signed)
Needs appointment and labs

## 2019-08-01 ENCOUNTER — Other Ambulatory Visit: Payer: Self-pay | Admitting: Sports Medicine

## 2019-08-01 DIAGNOSIS — I10 Essential (primary) hypertension: Secondary | ICD-10-CM

## 2019-09-07 ENCOUNTER — Other Ambulatory Visit: Payer: Self-pay | Admitting: Sports Medicine

## 2019-09-07 DIAGNOSIS — I10 Essential (primary) hypertension: Secondary | ICD-10-CM

## 2019-09-08 ENCOUNTER — Encounter: Payer: Self-pay | Admitting: Sports Medicine

## 2019-09-08 ENCOUNTER — Other Ambulatory Visit: Payer: Self-pay | Admitting: Sports Medicine

## 2019-09-08 DIAGNOSIS — I10 Essential (primary) hypertension: Secondary | ICD-10-CM

## 2019-09-08 DIAGNOSIS — E119 Type 2 diabetes mellitus without complications: Secondary | ICD-10-CM

## 2019-09-08 LAB — COMPREHENSIVE METABOLIC PANEL
AG Ratio: 1.3 (calc) (ref 1.0–2.5)
ALT: 94 U/L — ABNORMAL HIGH (ref 9–46)
AST: 54 U/L — ABNORMAL HIGH (ref 10–40)
Albumin: 3.7 g/dL (ref 3.6–5.1)
Alkaline phosphatase (APISO): 69 U/L (ref 36–130)
BUN: 14 mg/dL (ref 7–25)
CO2: 28 mmol/L (ref 20–32)
Calcium: 8.6 mg/dL (ref 8.6–10.3)
Chloride: 104 mmol/L (ref 98–110)
Creat: 0.64 mg/dL (ref 0.60–1.35)
Globulin: 2.9 g/dL (calc) (ref 1.9–3.7)
Glucose, Bld: 177 mg/dL — ABNORMAL HIGH (ref 65–99)
Potassium: 3.8 mmol/L (ref 3.5–5.3)
Sodium: 141 mmol/L (ref 135–146)
Total Bilirubin: 0.6 mg/dL (ref 0.2–1.2)
Total Protein: 6.6 g/dL (ref 6.1–8.1)

## 2019-09-08 LAB — LIPID PANEL W/REFLEX DIRECT LDL
Cholesterol: 141 mg/dL (ref <200)
HDL: 30 mg/dL — ABNORMAL LOW (ref >=40)
LDL Cholesterol (Calc): 91 mg/dL (calc)
Non-HDL Cholesterol (Calc): 111 mg/dL (calc) (ref <130)
Total CHOL/HDL Ratio: 4.7 (calc) (ref <5.0)
Triglycerides: 102 mg/dL (ref <150)

## 2019-09-08 LAB — CBC
HCT: 41.8 % (ref 38.5–50.0)
Hemoglobin: 14.1 g/dL (ref 13.2–17.1)
MCH: 29.2 pg (ref 27.0–33.0)
MCHC: 33.7 g/dL (ref 32.0–36.0)
MCV: 86.5 fL (ref 80.0–100.0)
MPV: 11.3 fL (ref 7.5–12.5)
Platelets: 252 10*3/uL (ref 140–400)
RBC: 4.83 10*6/uL (ref 4.20–5.80)
RDW: 13.4 % (ref 11.0–15.0)
WBC: 7.5 10*3/uL (ref 3.8–10.8)

## 2019-09-08 LAB — HEMOGLOBIN A1C
Hgb A1c MFr Bld: 8.6 % of total Hgb — ABNORMAL HIGH (ref <5.7)
Mean Plasma Glucose: 200 (calc)
eAG (mmol/L): 11.1 (calc)

## 2019-09-08 LAB — TSH: TSH: 3.41 mIU/L (ref 0.40–4.50)

## 2019-09-08 LAB — VITAMIN D 25 HYDROXY (VIT D DEFICIENCY, FRACTURES): Vit D, 25-Hydroxy: 23 ng/mL — ABNORMAL LOW (ref 30–100)

## 2019-09-08 MED ORDER — OZEMPIC (0.25 OR 0.5 MG/DOSE) 2 MG/1.5ML ~~LOC~~ SOPN: 1.0000 | PEN_INJECTOR | SUBCUTANEOUS | 1 refills | Status: DC

## 2019-09-08 MED ORDER — OZEMPIC (1 MG/DOSE) 2 MG/1.5ML ~~LOC~~ SOPN: 1.0000 mg | PEN_INJECTOR | SUBCUTANEOUS | 11 refills | Status: DC

## 2019-09-08 MED ORDER — CHLORTHALIDONE 50 MG PO TABS: 100.0000 mg | ORAL_TABLET | Freq: Every day | ORAL | 0 refills | Status: DC

## 2019-09-08 MED ORDER — VITAMIN D (ERGOCALCIFEROL) 1.25 MG (50000 UNIT) PO CAPS: 50000.0000 [IU] | ORAL_CAPSULE | ORAL | 0 refills | Status: AC

## 2019-09-08 NOTE — Assessment & Plan Note (Signed)
Hemoglobin A1c is in the diabetic range, restarting Saxenda will help, vitamin D is low, calling in supplement.  Considering that A1c is in the diabetic range we could potentially switch to Ozempic which is once a week and has better weight loss if okay with him.

## 2019-09-08 NOTE — Addendum Note (Signed)
Addended by: Silverio Decamp on: 09/08/2019 11:38 AM   Modules accepted: Orders

## 2019-09-20 ENCOUNTER — Other Ambulatory Visit: Payer: Self-pay | Admitting: Sports Medicine

## 2019-09-20 DIAGNOSIS — I10 Essential (primary) hypertension: Secondary | ICD-10-CM

## 2019-09-21 ENCOUNTER — Other Ambulatory Visit: Payer: Self-pay | Admitting: Sports Medicine

## 2019-09-21 DIAGNOSIS — I10 Essential (primary) hypertension: Secondary | ICD-10-CM

## 2019-09-21 MED ORDER — LISINOPRIL 20 MG PO TABS: 20.0000 mg | ORAL_TABLET | Freq: Every day | ORAL | 0 refills | Status: DC

## 2019-09-21 MED ORDER — LISINOPRIL 20 MG PO TABS: 20.0000 mg | ORAL_TABLET | Freq: Every day | ORAL | 1 refills | Status: DC

## 2019-10-31 ENCOUNTER — Other Ambulatory Visit: Payer: Self-pay | Admitting: Sports Medicine

## 2019-10-31 DIAGNOSIS — E119 Type 2 diabetes mellitus without complications: Secondary | ICD-10-CM

## 2020-01-17 ENCOUNTER — Other Ambulatory Visit: Payer: Self-pay | Admitting: Sports Medicine

## 2020-01-17 DIAGNOSIS — F320 Major depressive disorder, single episode, mild: Secondary | ICD-10-CM

## 2020-01-18 NOTE — Telephone Encounter (Signed)
Must make appointment 

## 2020-02-14 ENCOUNTER — Other Ambulatory Visit: Payer: Self-pay | Admitting: Sports Medicine

## 2020-02-14 DIAGNOSIS — I10 Essential (primary) hypertension: Secondary | ICD-10-CM

## 2020-03-27 ENCOUNTER — Other Ambulatory Visit: Payer: Self-pay | Admitting: Sports Medicine

## 2020-03-27 DIAGNOSIS — I10 Essential (primary) hypertension: Secondary | ICD-10-CM

## 2020-12-23 ENCOUNTER — Telehealth: Payer: BLUE CROSS/BLUE SHIELD | Admitting: Family

## 2020-12-23 DIAGNOSIS — S99921A Unspecified injury of right foot, initial encounter: Secondary | ICD-10-CM

## 2020-12-23 NOTE — Progress Notes (Signed)
Based on what you shared with me, I feel your condition warrants further evaluation and I recommend that you be seen for a face to face office visit.   NOTE: If you entered your credit card information for this eVisit, you will not be charged. You may see a "hold" on your card for the $35 but that hold will drop off and you will not have a charge processed.   If you are having a true medical emergency please call 911.      For an urgent face to face visit, [facility] has five urgent care centers for your convenience:     [facility] [place] Get Driving Directions [phone] [address] [place], [place] [address] . 10 am - 6pm Monday - Friday    [facility] ([place]) Get Driving Directions [phone] [address] [place], [place] [address] . 10 am to 8 pm Monday-Friday . 12 pm to 8 pm Saturday-Sunday     [facility] Get Driving Directions [phone] [address], [address] [place], [place] [address] . 8 am to 8 pm Monday-Friday . 9 am to 6 pm Saturday . 11 am to 6 pm Sunday     [facility] [place] Get Driving Directions  [phone] [address] [place], [place] [address] . 8 am to 8 pm Monday-Friday . 8 am to 4 pm Saturday-Sunday   [facility] [place] Get Driving Directions [phone] [address] [place], [place] [address] . 12 pm to 6 pm Monday-Friday      Your e-visit answers were reviewed by a board certified advanced clinical practitioner to complete your personal care plan.  Thank you for using e-Visits.     

## 2023-07-15 ENCOUNTER — Ambulatory Visit
Admission: EM | Admit: 2023-07-15 | Discharge: 2023-07-15 | Disposition: A | Discharge status: Home / Self Care | Payer: BC Managed Care – PPO | Attending: Family Medicine | Admitting: Family Medicine

## 2023-07-15 ENCOUNTER — Encounter: Payer: Self-pay | Admitting: Emergency Medicine

## 2023-07-15 DIAGNOSIS — L03115 Cellulitis of right lower limb: Secondary | ICD-10-CM | POA: Diagnosis not present

## 2023-07-15 DIAGNOSIS — R600 Localized edema: Secondary | ICD-10-CM

## 2023-07-15 DIAGNOSIS — I872 Venous insufficiency (chronic) (peripheral): Secondary | ICD-10-CM | POA: Diagnosis not present

## 2023-07-15 MED ORDER — CEFADROXIL 500 MG PO CAPS: 500.0000 mg | ORAL_CAPSULE | Freq: Two times a day (BID) | ORAL | 0 refills | Status: AC

## 2023-07-15 MED ORDER — CEFTRIAXONE SODIUM 1 G IJ SOLR
1000.0000 mg | Freq: Once | INTRAMUSCULAR | Status: AC
  Administered 2023-07-15: 1000 mg via INTRAMUSCULAR

## 2023-07-15 NOTE — ED Provider Notes (Signed)
[name]    CSN: [id number] Arrival date & time: 07/15/23  1658      History   Chief Complaint Chief Complaint  Patient presents with   Rash    HPI Alexander Russo is a 42 y.o. male.   HPI  Patient has morbid obesity and chronic venous stasis and edema.  He has venous stasis changes of his leg with erythema and dermatitis.  This is his baseline.  His right leg has gotten more swollen, more painful, and more red.  He is concern for cellulitis.  He had a spell last night where he could not get warm.  He thought it was from his air conditioner, however, in retrospect could have been chills.  He does not have any fever chills or malaise today  Past Medical History:  Diagnosis Date   Hypertension    Obesity     Patient Active Problem List   Diagnosis Date Noted   Controlled type 2 diabetes mellitus without complication, without long-term current use of insulin (HCC) 09/08/2019   Depression, major, single episode, mild (HCC) 03/12/2019   Subclinical hypothyroidism 08/22/2017   Transaminitis 10/20/2015   Obstructive sleep apnea 07/12/2015   Essential hypertension, benign 03/24/2013   Obesity 03/24/2013   Annual physical exam 03/24/2013    Past Surgical History:  Procedure Laterality Date   KNEE SURGERY     TUMOR REMOVAL     TYMPANOSTOMY TUBE PLACEMENT         Home Medications    Prior to Admission medications   Medication Sig Start Date End Date Taking? Authorizing Provider  cefadroxil (DURICEF) 500 MG capsule Take 1 capsule (500 mg total) by mouth 2 (two) times daily. 07/15/23  Yes Eustace Moore, MD  levothyroxine (SYNTHROID) 50 MCG tablet Take 1 tablet (50 mcg total) by mouth daily. NEEDS LABS AND APPOINTMENT 07/28/19  Yes Monica Becton, MD  metFORMIN (GLUCOPHAGE) 500 MG tablet Take 500 mg by mouth daily with breakfast. 07/10/21  Yes [provider]  MOUNJARO 10 MG/0.5ML Pen Inject 10 mg into the skin once a week.   Yes [provider]  NIFEdipine (ADALAT CC) 30 MG 24 hr tablet Take 30 mg by mouth daily.   Yes [provider]  rosuvastatin (CRESTOR) 10 MG tablet Take 1 tablet by mouth daily. 10/05/22  Yes [provider]  tadalafil (CIALIS) 10 MG tablet Take 10 mg by mouth daily as needed.   Yes [provider]  valsartan-hydrochlorothiazide (DIOVAN-HCT) 320-25 MG tablet Take 1 tablet by mouth daily.   Yes [provider]  Vitamin D, Ergocalciferol, (DRISDOL) 1.25 MG (50000 UT) CAPS capsule Take 1 capsule (50,000 Units total) by mouth every 7 (seven) days. Take for 8 total doses(weeks) 09/08/19  Yes Monica Becton, MD    Family History Family History  Problem Relation Age of Onset   Cancer Mother    Depression Mother    Hypertension Mother    Multiple sclerosis Mother    Hypertension Father    Depression Sister    Hypertension Sister     Social History Social History   Tobacco Use   Smoking status: Never   Smokeless tobacco: Never  Vaping Use   Vaping status: Never Used  Substance Use Topics   Alcohol use: Not Currently   Drug use: No     Allergies   Patient has no known allergies.   Review of Systems Review of Systems See HPI  Physical Exam Triage Vital  Signs ED Triage Vitals  Encounter Vitals Group     BP 07/15/23 1730 (!) 156/88     Systolic BP Percentile --      Diastolic BP Percentile --      Pulse Rate 07/15/23 1730 86     Resp 07/15/23 1730 18     Temp 07/15/23 1730 98.4 F (36.9 C)     Temp Source 07/15/23 1730 Oral     SpO2 07/15/23 1730 96 %     Weight --      Height --      Head Circumference --      Peak Flow --      Pain Score 07/15/23 1731 5     Pain Loc --      Pain Education --      Exclude from Growth Chart --    No data found.  Updated Vital Signs BP (!) 156/88 (BP Location: Right Arm)   Pulse 86   Temp 98.4 F (36.9 C) (Oral)   Resp 18   SpO2 96%   Physical Exam Constitutional:      General: He is  not in acute distress.    Appearance: He is well-developed. He is obese.  HENT:     Head: Normocephalic and atraumatic.  Eyes:     Conjunctiva/sclera: Conjunctivae normal.     Pupils: Pupils are equal, round, and reactive to light.  Cardiovascular:     Rate and Rhythm: Normal rate.  Pulmonary:     Effort: Pulmonary effort is normal. No respiratory distress.  Abdominal:     General: There is no distension.     Palpations: Abdomen is soft.  Musculoskeletal:        General: Normal range of motion.     Cervical back: Normal range of motion.     Right lower leg: Edema present.     Left lower leg: Edema present.  Skin:    General: Skin is warm and dry.     Comments: Patient has pedal edema right greater than left with a deep red coloration, tenderness to palp and warmth  Neurological:     Mental Status: He is alert.      UC Treatments / Results  Labs (all labs ordered are listed, but only abnormal results are displayed) Labs Reviewed - No data to display  EKG   Radiology No results found.  Procedures Procedures (including critical care time)  Medications Ordered in UC Medications  cefTRIAXone (ROCEPHIN) injection 1,000 mg (1,000 mg Intramuscular Given 07/15/23 1816)    Initial Impression / Assessment and Plan / UC Course  I have reviewed the triage vital signs and the nursing notes.  Pertinent labs & imaging results that were available during my care of the patient were reviewed by me and considered in my medical decision making (see chart for details).     Because of concern for systemic symptoms are going to give her a gram of Rocephin in addition to antibiotics p.o.  Follow-up with PCP Final Clinical Impressions(s) / UC Diagnoses   Final diagnoses:  Venous stasis dermatitis  Cellulitis of right lower extremity  Pedal edema     Discharge Instructions      ELEVATE LEG TAKE THE DURICEF ANTIBIOTIC 2 X A DAY CALL FOR PROBLEMS   ED Prescriptions      Medication Sig Dispense Auth. Provider   cefadroxil (DURICEF) 500 MG capsule Take 1 capsule (500 mg total) by mouth 2 (two) times daily. 20 capsule Delton See,  Letta Pate, MD      PDMP not reviewed this encounter.   Eustace Moore, MD 07/15/23 [phone]

## 2023-07-15 NOTE — ED Triage Notes (Signed)
Patient states he noticed a spot on his right lower leg Friday night, that may have turned into cellulitis.  The area is warm to touch and painful today.  Denies any itching.

## 2023-07-15 NOTE — Discharge Instructions (Signed)
ELEVATE LEG TAKE THE DURICEF ANTIBIOTIC 2 X A DAY CALL FOR PROBLEMS
